# Patient Record
Sex: Female | Born: 1960 | Race: White | Hispanic: No | Marital: Married | Smoking: Former smoker
Health system: Southern US, Community
[De-identification: ages and names within clinical notes are randomized; demographics above are authoritative.]

## PROBLEM LIST (undated history)

## (undated) DIAGNOSIS — C801 Malignant (primary) neoplasm, unspecified: Secondary | ICD-10-CM

## (undated) DIAGNOSIS — T7840XA Allergy, unspecified, initial encounter: Secondary | ICD-10-CM

## (undated) DIAGNOSIS — B019 Varicella without complication: Secondary | ICD-10-CM

## (undated) HISTORY — DX: Varicella without complication: B01.9

## (undated) HISTORY — DX: Allergy, unspecified, initial encounter: T78.40XA

## (undated) HISTORY — PX: TUBAL LIGATION: SHX77

## (undated) HISTORY — PX: SALPINGOOPHORECTOMY: SHX82

---

## 1999-07-19 HISTORY — PX: ABDOMINAL HYSTERECTOMY: SHX81

## 2003-05-12 LAB — HM COLONOSCOPY: HM Colonoscopy: NORMAL

## 2004-06-08 ENCOUNTER — Ambulatory Visit: Payer: Self-pay | Admitting: Unknown Physician Specialty

## 2004-06-17 ENCOUNTER — Ambulatory Visit: Payer: Self-pay | Admitting: Obstetrics & Gynecology

## 2004-06-21 ENCOUNTER — Inpatient Hospital Stay: Payer: Self-pay

## 2005-06-24 ENCOUNTER — Ambulatory Visit: Payer: Self-pay | Admitting: Unknown Physician Specialty

## 2005-09-05 ENCOUNTER — Ambulatory Visit: Payer: Self-pay | Admitting: Gastroenterology

## 2006-09-05 ENCOUNTER — Ambulatory Visit: Payer: Self-pay | Admitting: Unknown Physician Specialty

## 2008-01-16 ENCOUNTER — Ambulatory Visit: Payer: Self-pay | Admitting: Unknown Physician Specialty

## 2008-08-11 ENCOUNTER — Ambulatory Visit: Payer: Self-pay | Admitting: Unknown Physician Specialty

## 2009-04-07 ENCOUNTER — Ambulatory Visit: Payer: Self-pay | Admitting: Unknown Physician Specialty

## 2009-04-14 ENCOUNTER — Ambulatory Visit: Payer: Self-pay | Admitting: Unknown Physician Specialty

## 2009-07-18 HISTORY — PX: OTHER SURGICAL HISTORY: SHX169

## 2010-05-07 ENCOUNTER — Ambulatory Visit: Payer: Self-pay | Admitting: Unknown Physician Specialty

## 2011-05-10 LAB — HM PAP SMEAR: HM Pap smear: NORMAL

## 2011-05-10 LAB — HM MAMMOGRAPHY: HM Mammogram: NORMAL

## 2011-05-13 ENCOUNTER — Ambulatory Visit: Payer: Self-pay | Admitting: Unknown Physician Specialty

## 2012-01-23 ENCOUNTER — Ambulatory Visit: Payer: Self-pay | Admitting: Internal Medicine

## 2012-05-09 ENCOUNTER — Ambulatory Visit (INDEPENDENT_AMBULATORY_CARE_PROVIDER_SITE_OTHER): Payer: BC Managed Care – PPO | Admitting: Internal Medicine

## 2012-05-09 ENCOUNTER — Encounter: Payer: Self-pay | Admitting: Internal Medicine

## 2012-05-09 VITALS — BP 100/64 | HR 68 | Temp 97.7°F | Resp 12 | Ht 66.75 in | Wt 163.0 lb

## 2012-05-09 DIAGNOSIS — E663 Overweight: Secondary | ICD-10-CM

## 2012-05-09 DIAGNOSIS — Z1211 Encounter for screening for malignant neoplasm of colon: Secondary | ICD-10-CM

## 2012-05-09 DIAGNOSIS — Z6825 Body mass index (BMI) 25.0-25.9, adult: Secondary | ICD-10-CM

## 2012-05-09 DIAGNOSIS — R0789 Other chest pain: Secondary | ICD-10-CM

## 2012-05-09 NOTE — Patient Instructions (Addendum)
This is  Dr. Tullos's version of a  "Low GI"  Diet:  All of the foods can be found at grocery stores and in bulk at [employer].  The Atkins protein bars and shakes are available in more varieties at Target, WalMart and [employer].     7 AM Breakfast:  Low carbohydrate Protein  Shakes (I recommend the EAS AdvantEdge "Carb Control" shakes  Or the low carb shakes by Atkins.   Both are available everywhere:  In  cases at BJs  Or in 4 packs at grocery stores and pharmacies  2.5 carbs  (Alternative is  a toasted Arnold's Sandwhich Thin w/ peanut butter, a "Bagel Thin" with cream cheese and salmon) or  a scrambled egg burrito made with a low carb tortilla .  Avoid cereal and bananas, oatmeal too unless you are cooking the old fashioned kind that takes 30-40 minutes to prepare.  the rest is overly processed, has minimal fiber, and is loaded with carbohydrates!   10 AM: Protein bar by Atkins (the snack size, under 200 cal).  There are many varieties , available widely again or in bulk in limited varieties at BJs)  Other so called "protein bars" tend to be loaded with carbohydrates.  Remember, in food advertising, the word "energy" is synonymous for " carbohydrate."  Lunch: sandwich of turkey, (or any lunchmeat, grilled meat or canned tuna), fresh avocado, mayonnaise  and cheese on a lower carbohydrate pita bread, flatbread, or tortilla . Ok to use regular mayonnaise. The bread is the only source or carbohydrate that can be decreased (Joseph's makes a pita bread and a flat bread that are 50 cal and 4 net carbs ; Toufayan makes a low carb flatbread that's 100 cal and 9 net carbs  and  Mission makes a low carb whole wheat tortilla  That is 210 cal and 6 net carbs)  3 PM:  Mid day :  Another protein bar,  Or a  cheese stick (100 cal, 0 carbs),  Or 1 ounce of  almonds, walnuts, pistachios, pecans, peanuts,  Macadamia nuts. Or a Dannon light n Fit greek yogurt, 80 cal 8 net carbs . Avoid "granola"; the dried cranberries  and raisins are loaded with carbohydrates. Mixed nuts ok if no raisins or cranberries or dried fruit.      6 PM  Dinner:  "mean and green:"  Meat/chicken/fish or a high protein legume; , with a green salad, and a low GI  Veggie (broccoli, cauliflower, green beans, spinach, brussel sprouts. Lima beans) : Avoid "Low fat dressings, as well as [place] and [place]! They are loaded with sugar! Instead use ranch, vinagrette,  Blue cheese, etc  9 PM snack : Breyer's "low carb" fudgsicle or  ice cream bar (Carb Smart line), or  Weight Watcher's ice cream bar , or another "no sugar added" ice cream;a serving of fresh berries/cherries with whipped cream (Avoid bananas, pineapple, grapes  and watermelon on a regular basis because they are high in sugar)   Remember that snack Substitutions should be less than 15 to 20 carbs  Per serving. Remember to subtract fiber grams and sugar alcohols to get the "net carbs."    

## 2012-05-09 NOTE — Progress Notes (Signed)
Patient ID: Penny Roberts, female   DOB: 1960/09/20, 51 y.o.   MRN: [id number]   Patient Active Problem List  Diagnosis  . Musculoskeletal chest pain  . Overweight (BMI 25.0-29.9)    Subjective:  CC:   No chief complaint on file.   HPI:   Penny Roberts is a 51 y.o. 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   She is the dtr of Penny Roberts and has been very busy helping her mother regain her health after she developed a prosthetic hip infection which was finally addressed by [facility] after her local specialists failed to recognize ongoing infection, per patient.  She remains  very frustrated by her mother's decline which she feels was unnecessary. 2) Overweight.  Patient does a lot of physcal labor and cardio/interval trraining 3 times per week minimum with intentional  wt loss of 10 lbs,  Her goal is to lose an additional 10-20 more .  She works full time as an Production designer, theatre/television/film at [employer] and uses [employer]. She livers with her husband and 30 yr old child.   Past Medical History  Diagnosis Date  . Allergy   . Chicken pox     Past Surgical History  Procedure Date  . Tubal ligation   . 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 Salpingoophorectomy   . Abdominal hysterectomy 2001    due to emdometriosis    Family History  Problem Relation Age of Onset  . Heart disease Mother     A-Fib  . Cancer Father     colon cancer  . Alcohol abuse Father   . Hypertension Father   . Cancer Maternal Aunt     breast cancer    History   Social History  . Marital Status: Married    Spouse Name: N/A    Number of Children: N/A  . Years of Education: N/A   Occupational History  . Not on file.   Social History Main Topics  . Smoking status: Former Smoker    Quit date: 05/09/2004  . Smokeless  tobacco: Never Used  . Alcohol Use: Yes     occassional alcohol use  . Drug Use: No  . Sexually Active: Not on file   Other Topics Concern  . Not on file   Social History Narrative  . No narrative on file   No Known Allergies   Review of Systems:   The remainder of the review of systems was negative except those addressed in the HPI.       Objective:  BP 100/64  Pulse 68  Temp 97.7 F (36.5 C)  Resp 12  Ht 5' 6.75" (1.695 m)  Wt 163 lb (73.936 kg)  BMI 25.72 kg/m2  SpO2 99%  General appearance: alert, cooperative and appears stated age Ears: normal TM's and external ear canals both ears Throat: lips, mucosa, and tongue normal; teeth and gums normal Neck: no adenopathy, no carotid bruit, supple, symmetrical, trachea midline and thyroid not enlarged, symmetric, no tenderness/mass/nodules Back: symmetric, no curvature. ROM normal. No CVA tenderness. Lungs: clear to auscultation bilaterally Heart: regular rate and rhythm, S1, S2 normal, no murmur, click, rub or gallop Abdomen: soft, non-tender; bowel sounds normal; no masses,  no organomegaly Pulses: 2+ and symmetric Skin: Skin color,  texture, turgor normal. No rashes or lesions Lymph nodes: Cervical, supraclavicular, and axillary nodes normal.  Assessment and Plan:  Musculoskeletal chest pain Secondary use of power tools. Reassurance provided. Continue nonsteroidal anti-inflammatories.  Overweight (BMI 25.0-29.9) She has an excellent job of lowering her BMI to just about 25 with diet and exercise. She has a plateau. I've given her a copy of my low glycemic index diet for additional help in removing unnecessary carbs per her diet.   Updated Medication List Outpatient Encounter Prescriptions as of 05/09/2012  Medication Sig Dispense Refill  . aspirin 81 MG tablet Take 81 mg by mouth daily.      . fish oil-omega-3 fatty acids 1000 MG capsule Take 2 g by mouth daily.      . Multiple Vitamin (MULTIVITAMIN) capsule  Take 1 capsule by mouth daily.         Orders Placed This Encounter  Procedures  . HM MAMMOGRAPHY  . HM PAP SMEAR    No Follow-up on file.

## 2012-05-11 DIAGNOSIS — R0789 Other chest pain: Secondary | ICD-10-CM | POA: Insufficient documentation

## 2012-05-11 DIAGNOSIS — Z1211 Encounter for screening for malignant neoplasm of colon: Secondary | ICD-10-CM | POA: Insufficient documentation

## 2012-05-11 DIAGNOSIS — E663 Overweight: Secondary | ICD-10-CM | POA: Insufficient documentation

## 2012-05-11 NOTE — Assessment & Plan Note (Signed)
Secondary use of power tools. Reassurance provided. Continue nonsteroidal anti-inflammatories.

## 2012-05-11 NOTE — Assessment & Plan Note (Signed)
She has an excellent job of lowering her BMI to just about 25 with diet and exercise. She has a plateau. I've given her a copy of my low glycemic index diet for additional help in removing unnecessary carbs per her diet.

## 2012-05-11 NOTE — Assessment & Plan Note (Signed)
She is due for screening colonoscopy  And requests referral to [facility].

## 2012-07-12 ENCOUNTER — Encounter: Payer: Self-pay | Admitting: Adult Health

## 2012-07-12 ENCOUNTER — Telehealth: Payer: Self-pay | Admitting: Internal Medicine

## 2012-07-12 ENCOUNTER — Ambulatory Visit (INDEPENDENT_AMBULATORY_CARE_PROVIDER_SITE_OTHER): Payer: BC Managed Care – PPO | Admitting: Adult Health

## 2012-07-12 VITALS — BP 116/62 | HR 72 | Temp 98.2°F | Ht 66.75 in | Wt 166.8 lb

## 2012-07-12 DIAGNOSIS — J329 Chronic sinusitis, unspecified: Secondary | ICD-10-CM

## 2012-07-12 MED ORDER — AZITHROMYCIN 250 MG PO TABS: ORAL_TABLET | ORAL | Status: DC

## 2012-07-12 MED ORDER — FLUTICASONE PROPIONATE 50 MCG/ACT NA SUSP: NASAL | Status: DC

## 2012-07-12 NOTE — Progress Notes (Signed)
  Subjective:    Patient ID: Penny Roberts, female    DOB: 1960/11/27, 51 y.o.   MRN: [id number]  HPI  Patient presents to clinic today with congestion, painful sinuses, fluid/fullness in ears that has been ongoing for ~ 8 days. Drainage from sinuses is green in color. She has tried mucinex, and nyquil at night to help her sleep but this has not helped her much. Pt has been using saline to rinse out sinuses. She reports having chills and fever.  Current Outpatient Prescriptions on File Prior to Visit  Medication Sig Dispense Refill  . aspirin 81 MG tablet Take 81 mg by mouth daily.      . fish oil-omega-3 fatty acids 1000 MG capsule Take 2 g by mouth daily.      . Multiple Vitamin (MULTIVITAMIN) capsule Take 1 capsule by mouth daily.      . fluticasone (FLONASE) 50 MCG/ACT nasal spray 2 sprays into each nostril daily  16 g  6      Review of Systems  Constitutional: Positive for fever, chills, appetite change and fatigue.  HENT: Positive for congestion and postnasal drip.   Respiratory: Negative for chest tightness and shortness of breath.   Gastrointestinal: Positive for nausea.  Genitourinary: Negative.   Neurological: Positive for headaches.  Psychiatric/Behavioral: Negative.     BP 116/62  Pulse 72  Temp 98.2 F (36.8 C) (Oral)  Ht 5' 6.75" (1.695 m)  Wt 166 lb 12 oz (75.637 kg)  BMI 26.31 kg/m2  SpO2 97%     Objective:   Physical Exam  Constitutional: She is oriented to person, place, and time. She appears well-developed and well-nourished. No distress.  HENT:  Head: Normocephalic.       Right ear with bulging TM. Erythema of pharynx without exudate. Post nasal drip.  Eyes: Right eye exhibits no discharge. Left eye exhibits no discharge.  Cardiovascular: Normal rate, regular rhythm and normal heart sounds.  Exam reveals no gallop.   No murmur heard. Pulmonary/Chest: Effort normal and breath sounds normal. She has no wheezes. She has no rales.  Lymphadenopathy:    She  has no cervical adenopathy.  Neurological: She is alert and oriented to person, place, and time.  Skin: Skin is warm and dry.  Psychiatric: She has a normal mood and affect. Her behavior is normal. Judgment and thought content normal.           Assessment & Plan:

## 2012-07-12 NOTE — Assessment & Plan Note (Signed)
Ongoing sinusitis for ~ 8 days without resolution. Start azithromycin and flonase.

## 2012-07-12 NOTE — Telephone Encounter (Signed)
Patient Information:  Caller Name: Paiton  Phone: [phone]  Patient: Penny Roberts, Penny Roberts  Gender: Female  DOB: Feb 16, 1961  Age: 51 Years  PCP: Duncan Dull (Adults only)  Pregnant: No  Office Follow Up:  Does the office need to follow up with this patient?: Yes  Instructions For The Office: Please cal ASAP to advise if can be seen when she brings her mother in 07/12/12 at 1600 with Dr Dan Humphreys .  RN Note:  Mild wheezing present with severe sinus pain. Requests appointment at 1600 07/12/12 with Dr Dan Humphreys since she is bringing her mother for an appointment.  Symptoms  Reason For Call & Symptoms: Called for appointment for sinus cognestion with grreen nasal mucus  Reviewed Health History In EMR: Yes  Reviewed Medications In EMR: Yes  Reviewed Allergies In EMR: Yes  Reviewed Surgeries / Procedures: Yes  Date of Onset of Symptoms: 07/04/2012  Treatments Tried: Mucinex D, Nyquil hs, nasal saline  Treatments Tried Worked: Yes OB / GYN:  LMP: Unknown  Guideline(s) Used:  Sinus Pain and Congestion  Disposition Per Guideline:   Go to Office Now  Reason For Disposition Reached:   Severe sinus pain  Advice Given:  Hydration:  Drink plenty of liquids (6-8 glasses of water daily). If the air in your home is dry, use a cool mist humidifier  Expected Course:  Sinus congestion from viral upper respiratory infections (colds) usually lasts 5-10 days.  Occasionally a cold can worsen and turn into bacterial sinusitis. Clues to this are sinus symptoms lasting longer than 10 days, fever lasting longer than 3 days, and worsening pain. Bacterial sinusitis may need antibiotic treatment.

## 2012-07-12 NOTE — Patient Instructions (Addendum)
  Start your antibiotic today. Drink fluids to stay hydrated.  Call if you are not better by Monday.

## 2012-07-12 NOTE — Telephone Encounter (Signed)
Should be fine. We can put her on as acute overbook.  Will be a focused visit just for sinus infection.

## 2012-07-12 NOTE — Telephone Encounter (Signed)
Per Dr. Dan Humphreys we can overbook her in an acute spot.

## 2012-09-17 ENCOUNTER — Ambulatory Visit (INDEPENDENT_AMBULATORY_CARE_PROVIDER_SITE_OTHER): Payer: BC Managed Care – PPO | Admitting: Internal Medicine

## 2012-09-17 ENCOUNTER — Encounter: Payer: Self-pay | Admitting: Internal Medicine

## 2012-09-17 VITALS — BP 104/64 | HR 66 | Temp 98.6°F | Resp 12 | Ht 66.75 in | Wt 165.8 lb

## 2012-09-17 DIAGNOSIS — Z13220 Encounter for screening for lipoid disorders: Secondary | ICD-10-CM

## 2012-09-17 DIAGNOSIS — Z23 Encounter for immunization: Secondary | ICD-10-CM

## 2012-09-17 DIAGNOSIS — Z0000 Encounter for general adult medical examination without abnormal findings: Secondary | ICD-10-CM | POA: Insufficient documentation

## 2012-09-17 DIAGNOSIS — E559 Vitamin D deficiency, unspecified: Secondary | ICD-10-CM

## 2012-09-17 DIAGNOSIS — R5381 Other malaise: Secondary | ICD-10-CM

## 2012-09-17 DIAGNOSIS — R5383 Other fatigue: Secondary | ICD-10-CM

## 2012-09-17 DIAGNOSIS — Z6825 Body mass index (BMI) 25.0-25.9, adult: Secondary | ICD-10-CM

## 2012-09-17 DIAGNOSIS — Z1211 Encounter for screening for malignant neoplasm of colon: Secondary | ICD-10-CM

## 2012-09-17 DIAGNOSIS — E663 Overweight: Secondary | ICD-10-CM

## 2012-09-17 DIAGNOSIS — Z1239 Encounter for other screening for malignant neoplasm of breast: Secondary | ICD-10-CM

## 2012-09-17 LAB — LIPID PANEL
Cholesterol: 227 mg/dL — ABNORMAL HIGH (ref 0–200)
HDL: 93.1 mg/dL (ref >39.00)
Triglycerides: 76 mg/dL (ref 0.0–149.0)
VLDL: 15.2 mg/dL (ref 0.0–40.0)

## 2012-09-17 LAB — COMPREHENSIVE METABOLIC PANEL
AST: 27 U/L (ref 0–37)
Albumin: 3.9 g/dL (ref 3.5–5.2)
Alkaline Phosphatase: 47 U/L (ref 39–117)
BUN: 19 mg/dL (ref 6–23)
Creatinine, Ser: 0.6 mg/dL (ref 0.4–1.2)
Potassium: 4 mEq/L (ref 3.5–5.1)
Total Bilirubin: 0.9 mg/dL (ref 0.3–1.2)

## 2012-09-17 LAB — TSH: TSH: 0.76 u[IU]/mL (ref 0.35–5.50)

## 2012-09-17 NOTE — Assessment & Plan Note (Addendum)
She had a normal colonoscopy in 2007,  due to father's history of colon cancer.  She is overdue for  five-year followup. She will be referred to see her back to Dr. Mechele Collin for repeat colonoscopy.

## 2012-09-17 NOTE — Patient Instructions (Addendum)
 This is  my version of a  "Low GI"  Diet:  It is not ultra low carb, but will still lower your blood sugars and allow you to lose 5 to 10 lbs per month if you follow it carefully. All of the foods can be found at grocery stores and in bulk at [employer].  The Atkins protein bars and shakes are available in more varieties at Target, WalMart and [employer].     7 AM Breakfast:  Low carbohydrate Protein  Shakes (I recommend the EAS AdvantEdge "Carb Control" shakes  Or the low carb shakes by Atkins.   Both are available everywhere:  In  cases at BJs  Or in 4 packs at grocery stores and pharmacies  2.5 carbs  (Alternative is  a toasted Arnold's Sandwhich Thin w/ peanut butter, a "Bagel Thin" with cream cheese and salmon) or  a scrambled egg burrito made with a low carb tortilla .  Avoid cereal and bananas, oatmeal too unless you are cooking the old fashioned kind that takes 30-40 minutes to prepare.  the rest is overly processed, has minimal fiber, and is loaded with carbohydrates!   10 AM: Protein bar by Atkins (the snack size, under 200 cal).  There are many varieties , available widely again or in bulk in limited varieties at BJs)  Other so called "protein bars" tend to be loaded with carbohydrates.  Remember, in food advertising, the word "energy" is synonymous for " carbohydrate."  Lunch: sandwich of turkey, (or any lunchmeat, grilled meat or canned tuna), fresh avocado, mayonnaise  and cheese on a lower carbohydrate pita bread, flatbread, or tortilla . Ok to use regular mayonnaise. The bread is the only source or carbohydrate that can be decreased (Joseph's makes a pita bread and a flat bread that are 50 cal and 4 net carbs ; Toufayan makes a low carb flatbread that's 100 cal and 9 net carbs  and  Mission makes a low carb whole wheat tortilla  That is 210 cal and 6 net carbs)  3 PM:  Mid day :  Another protein bar,  Or a  cheese stick (100 cal, 0 carbs),  Or 1 ounce of  almonds, walnuts, pistachios,  pecans, peanuts,  Macadamia nuts. Or a Dannon light n Fit greek yogurt, 80 cal 8 net carbs . Avoid "granola"; the dried cranberries and raisins are loaded with carbohydrates. Mixed nuts ok if no raisins or cranberries or dried fruit.      6 PM  Dinner:  "mean and green:"  Meat/chicken/fish or a high protein legume; , with a green salad, and a low GI  Veggie (broccoli, cauliflower, green beans, spinach, brussel sprouts. Lima beans) : Avoid "Low fat dressings, as well as [place] and [place]! They are loaded with sugar! Instead use ranch, vinagrette,  Blue cheese, etc.  There is a low carb pasta by Dreamfield's available at [employer] that is acceptable and tastes great. Try Michel Angel's chicken piccata over low carb pasta. The chicken dish is 0 carbs, and can be found in frozen section at BJs and [employer]. Also try Aaron Sanchez's "Carnitas" (pulled pork, no sauce,  0 carbs) and his pot roast.   both are in the refrigerated section at BJs   Dreamfield's makes a low carb pasta only 5 g/serving.  Available at all grocery stores,  And tastes like normal pasta  9 PM snack : Breyer's "low carb" fudgsicle or  ice cream bar (Carb Smart   line), or  Weight Watcher's ice cream bar , or another "no sugar added" ice cream;a serving of fresh berries/cherries with whipped cream (Avoid bananas, pineapple, grapes  and watermelon on a regular basis because they are high in sugar)   Remember that snack Substitutions should be less than 10 carbs per serving and meals < 20 carbs. Remember to subtract fiber grams and sugar alcohols to get the "net carbs."  

## 2012-09-17 NOTE — Assessment & Plan Note (Deleted)
She has lost several pounds since her last visit. She is following a low glycemic index diet and exercising regularly. We discussed her current regimen and recommendations for modifications were offered. Screening for hypothyroidism diabetes and hyperlipidemia to be done today.

## 2012-09-17 NOTE — Progress Notes (Signed)
Patient ID: Penny Roberts, female   DOB: [birth year], 52 y.o.   MRN: [id number]    Subjective:     Penny Roberts is a 52 y.o. female and is here for a comprehensive physical exam. The patient reports  the following concerns:.She is S/p TAH/BSO for severe endometriosis but still getting PAPs (last one by former PCP Oct 12).  Her last mammogram was Oct 2012 and she performs monthly breast exams. She is scheduled to see her dermatologist for recent development of multiple nevi. And finally she recognizes that she is overweight.  She wants to lose 15 more lbs.  She is exercising regularly and following a low carbohydrate diet .  Her goal is 150 lbs    History   Social History  . Marital Status: Married    Spouse Name: N/A    Number of Children: N/A  . Years of Education: N/A   Occupational History  . Not on file.   Social History Main Topics  . Smoking status: Former Smoker    Quit date: 05/09/2004  . Smokeless tobacco: Never Used  . Alcohol Use: Yes     Comment: occassional alcohol use  . Drug Use: No  . Sexually Active: Not on file   Other Topics Concern  . Not on file   Social History Narrative  . No narrative on file   Health Maintenance  Topic Date Due  . Influenza Vaccine  03/18/1962  . Tetanus/tdap  11/17/1980  . Mammogram  11/18/2011  . Colonoscopy  05/11/2013  . Pap Smear  05/09/2014    The following portions of the patient's history were reviewed and updated as appropriate: allergies, current medications, past family history, past medical history, past social history, past surgical history and problem list.  Review of Systems A comprehensive review of systems was negative.   Objective:   BP 104/64  Pulse 66  Temp(Src) 98.6 F (37 C) (Oral)  Ht 5' 6.75" (1.695 m)  Wt 165 lb 12 oz (75.184 kg)  BMI 26.17 kg/m2  SpO2 99%  General Appearance:    Alert, cooperative, no distress, appears stated age  Head:    Normocephalic, without obvious abnormality, atraumatic   Eyes:    PERRL, conjunctiva/corneas clear, EOM's intact, fundi    benign, both eyes  Ears:    Normal TM's and external ear canals, both ears  Nose:   Nares normal, septum midline, mucosa normal, no drainage    or sinus tenderness  Throat:   Lips, mucosa, and tongue normal; teeth and gums normal  Neck:   Supple, symmetrical, trachea midline, no adenopathy;    thyroid:  no enlargement/tenderness/nodules; no carotid   bruit or JVD  Back:     Symmetric, no curvature, ROM normal, no CVA tenderness  Lungs:     Clear to auscultation bilaterally, respirations unlabored  Chest Wall:    No tenderness or deformity   Heart:    Regular rate and rhythm, S1 and S2 normal, no murmur, rub   or gallop  Breast Exam:    No tenderness, masses, or nipple abnormality  Abdomen:     Soft, non-tender, bowel sounds active all four quadrants,    no masses, no organomegaly  Genitalia:    Normal female without lesion, discharge or tenderness. Cervix is surgically absent, adnexa surgically absent. Vaginal vault without discharge or erythema  Extremities:   Extremities normal, atraumatic, no cyanosis or edema  Pulses:   2+ and symmetric all extremities  Skin:   Skin  color, texture, turgor normal, no rashes or lesions  Lymph nodes:   Cervical, supraclavicular, and axillary nodes normal  Neurologic:   CNII-XII intact, normal strength, sensation and reflexes    throughout    .    Assessment:      Routine general medical examination at a health care facility Annual comprehensive exam was done including breast and pelvic exam were done. She is status post total hysterectomy and salpingo-oophorectomy. All screenings have been addressed . She will be referred for repeat colonoscopy in Oct 2014 as she had one in 2004 at age 29 due to first degree relative having colon cancer. Tdap vaccine was given today as this was due  Screening for colon cancer She had a normal colonoscopy in 2007,  due to father's history of colon  cancer.  She is overdue for  five-year followup. She will be referred to see her back to Dr. Mechele Collin for repeat colonoscopy.   Updated Medication List Outpatient Encounter Prescriptions as of 09/17/2012  Medication Sig Dispense Refill  . aspirin 81 MG tablet Take 81 mg by mouth daily.      . calcium carbonate (OS-CAL) 600 MG TABS Take 600 mg by mouth daily.      . fish oil-omega-3 fatty acids 1000 MG capsule Take 1 g by mouth daily.       . Multiple Vitamin (MULTIVITAMIN) capsule Take 1 capsule by mouth daily.      . fluticasone (FLONASE) 50 MCG/ACT nasal spray 2 sprays into each nostril daily  16 g  6  . [DISCONTINUED] azithromycin (ZITHROMAX) 250 MG tablet Take 2 tablets on day 1, then take 1 tablet daily for the next 4 days.  6 tablet  0   No facility-administered encounter medications on file as of 09/17/2012.

## 2012-09-17 NOTE — Assessment & Plan Note (Addendum)
Annual comprehensive exam was done including breast and pelvic exam were done. She is status post total hysterectomy and salpingo-oophorectomy. All screenings have been addressed . She will be referred for repeat colonoscopy in Oct 2014 as she had one in 2004 at age 51 due to first degree relative having colon cancer. Tdap vaccine was given today as this was due

## 2012-09-24 ENCOUNTER — Encounter: Payer: Self-pay | Admitting: *Deleted

## 2012-10-16 ENCOUNTER — Telehealth: Payer: Self-pay | Admitting: General Practice

## 2012-10-16 DIAGNOSIS — Z1211 Encounter for screening for malignant neoplasm of colon: Secondary | ICD-10-CM

## 2012-10-16 NOTE — Telephone Encounter (Signed)
Pt called to inform office that she had a colonoscopy done in February by Dr. Terance Ice?

## 2012-10-25 ENCOUNTER — Ambulatory Visit: Payer: Self-pay | Admitting: Internal Medicine

## 2012-11-22 ENCOUNTER — Encounter: Payer: Self-pay | Admitting: Internal Medicine

## 2014-08-27 ENCOUNTER — Encounter (INDEPENDENT_AMBULATORY_CARE_PROVIDER_SITE_OTHER): Payer: Self-pay

## 2014-08-27 ENCOUNTER — Ambulatory Visit (INDEPENDENT_AMBULATORY_CARE_PROVIDER_SITE_OTHER): Payer: BC Managed Care – PPO | Admitting: Internal Medicine

## 2014-08-27 ENCOUNTER — Encounter: Payer: Self-pay | Admitting: Internal Medicine

## 2014-08-27 VITALS — BP 118/70 | HR 68 | Temp 98.3°F | Resp 16 | Ht 67.5 in | Wt 180.2 lb

## 2014-08-27 DIAGNOSIS — Z23 Encounter for immunization: Secondary | ICD-10-CM

## 2014-08-27 DIAGNOSIS — R5383 Other fatigue: Secondary | ICD-10-CM

## 2014-08-27 DIAGNOSIS — E785 Hyperlipidemia, unspecified: Secondary | ICD-10-CM

## 2014-08-27 DIAGNOSIS — E663 Overweight: Secondary | ICD-10-CM

## 2014-08-27 DIAGNOSIS — Z1239 Encounter for other screening for malignant neoplasm of breast: Secondary | ICD-10-CM

## 2014-08-27 DIAGNOSIS — Z0000 Encounter for general adult medical examination without abnormal findings: Secondary | ICD-10-CM

## 2014-08-27 NOTE — Patient Instructions (Signed)
This is  my version of a  "Low GI"  Weight loss Diet:  It will allow you to lose 4 to 8  lbs  per month if you follow it carefully.  Your goal with exercise is a minimum of 30 minutes of aerobic exercise 5 days per week (Walking does not count once it becomes easy!)     All of the foods can be found at grocery stores and in bulk at [employer].  The Atkins protein bars and shakes are available in more varieties at Target, WalMart and [employer]'s Foods.     7 AM Breakfast:  Choose from the following:  Low carbohydrate Protein  Shakes (I recommend the EAS AdvantEdge "Carb Control" shakes, Atkins,  Muscle Milk or Premier Protein shakes  All are < 4  carbs   a scrambled egg/bacon/cheese burrito made with Mission's "carb balance" whole wheat tortilla  (about 10 net carbs )  A slice of home made fritatta (egg based dish without a crust:  google it)    Avoid cereal and bananas, oatmeal and cream of wheat and grits. They are loaded with carbohydrates! You can make your own "smootjies".   Just avoid bananas and pineapple   10 AM: high protein snack  Protein bar by Atkins  Or KIND  (the snack size, under 200 cal, usually < 6 net carbs).    A stick of cheese:  Around 1 carb,  100 cal      Other so called "protein bars" and Greek yogurts tend to be loaded with carbohydrates.  Remember, in food advertising, the word "energy" is synonymous for " carbohydrate."  Lunch:   A Sandwich using the bread choices listed, Can use any  Eggs,  lunchmeat, grilled meat or canned tuna), avocado, regular mayo/mustard  and cheese.  A Salad using blue cheese, ranch,  Goddess or vinagrette,  No croutons or "confetti" and no "candied nuts" but regular nuts OK.   No pretzels or chips.  Pickles and miniature sweet peppers are a good low carb alternative that provide a "crunch"  The bread is the only source of carbohydrate in a sandwich and  can be decreased by trying some of these alternatives to traditional loaf bread  Joseph's  makes a pita bread and a flat bread that are 50 cal and 4 net carbs available at BJs and WalMart.  This can be toasted to use with hummous as well  Toufayan makes a low carb flatbread that's 100 cal and 9 net carbs available at [employer] and [employer] makes 2 sizes of  Low carb whole wheat tortilla  (The large one is 210 cal and 6 net carbs)  Flat Out makes flatbreads that are low carb as well  Avoid "Low fat dressings, as well as Reyne Dumas and [address] dressings They are loaded with sugar!   3 PM/ Mid day  Snack:  Consider  1 ounce of  almonds, walnuts, pistachios, pecans, peanuts,  Macadamia nuts or a nut medley.  Avoid "granola"; the dried cranberries and raisins are loaded with carbohydrates. Mixed nuts as long as there are no raisins,  cranberries or dried fruit.    Try the prosciutto/mozzarella cheese sticks by [employer]  In deli /backery section   High protein   To avoid overindulging in snacks: Try drinking a glass of unsweeted almond/coconut milk  Or a cup of coffee with your Atkins chocolate bar to keep you from having 3!!!   Pork rinds!  Yes  Pork Rinds        6 PM  Dinner:     Meat/fowl/fish with a green salad, and either broccoli, cauliflower, green beans, spinach, brussel sprouts or  Lima beans. DO NOT BREAD THE PROTEIN!!      There is a low carb pasta by Dreamfield's that is acceptable and tastes great: only 5 digestible carbs/serving.( All grocery stores but BJs carry it )  Try Hurley Cisco Angelo's chicken piccata or chicken or eggplant parm over low carb pasta.(Lowes and BJs)   Marjory Lies Sanchez's "Carnitas" (pulled pork, no sauce,  0 carbs) or his beef pot roast to make a dinner burrito (at BJ's)  Pesto over low carb pasta (bj's sells a good quality pesto in the center refrigerated section of the deli   Try satueeing  Cheral Marker with mushroooms  Whole wheat pasta is still full of digestible carbs and  Not as low in glycemic index as Dreamfield's.   Brown rice is still rice,   So skip the rice and noodles if you eat [place] or [place] (or at least limit to 1/2 cup)  9 PM snack :   Breyer's "low carb" fudgsicle or  ice cream bar (Carb Smart line), or  Weight Watcher's ice cream bar , or another "no sugar added" ice cream;  a serving of fresh berries/cherries with whipped cream   Cheese or DANNON'S LlGHT N FIT GREEK YOGURT or the [place] greek yogurt   8 ounces of [place] unsweetened almond/cococunut milk  Cheese and crackers (using WASA crackers,  They are low carb) or peanut butter on low carb crackers or pita bread     Avoid bananas, pineapple, grapes  and watermelon on a regular basis because they are high in sugar.  THINK OF THEM AS DESSERT  Remember that snack Substitutions should be less than 10 NET carbs per serving and meals should be < 20 net carbs. Remember that carbohydrates from fiber do not affect blood sugar, so you can  subtract fiber grams to get the "net carbs " of any particular food item.    Health Maintenance Adopting a healthy lifestyle and getting preventive care can go a long way to promote health and wellness. Talk with your health care provider about what schedule of regular examinations is right for you. This is a good chance for you to check in with your provider about disease prevention and staying healthy. In between checkups, there are plenty of things you can do on your own. Experts have done a lot of research about which lifestyle changes and preventive measures are most likely to keep you healthy. Ask your health care provider for more information. WEIGHT AND DIET  Eat a healthy diet  Be sure to include plenty of vegetables, fruits, low-fat dairy products, and lean protein.  Do not eat a lot of foods high in solid fats, added sugars, or salt.  Get regular exercise. This is one of the most important things you can do for your health.  Most adults should exercise for at least 150 minutes each week. The exercise should increase your  heart rate and make you sweat (moderate-intensity exercise).  Most adults should also do strengthening exercises at least twice a week. This is in addition to the moderate-intensity exercise.  Maintain a healthy weight  Body mass index (BMI) is a measurement that can be used to identify possible weight problems. It estimates body fat based on height and weight. Your health care provider can help determine your  BMI and help you achieve or maintain a healthy weight.  For females 26 years of age and older:   A BMI below 18.5 is considered underweight.  A BMI of 18.5 to 24.9 is normal.  A BMI of 25 to 29.9 is considered overweight.  A BMI of 30 and above is considered obese.  Watch levels of cholesterol and blood lipids  You should start having your blood tested for lipids and cholesterol at 53 years of age, then have this test every 5 years.  You may need to have your cholesterol levels checked more often if:  Your lipid or cholesterol levels are high.  You are older than 53 years of age.  You are at high risk for heart disease.  CANCER SCREENING   Lung Cancer  Lung cancer screening is recommended for adults 20-4 years old who are at high risk for lung cancer because of a history of smoking.  A yearly low-dose CT scan of the lungs is recommended for people who:  Currently smoke.  Have quit within the past 15 years.  Have at least a 30-pack-year history of smoking. A pack year is smoking an average of one pack of cigarettes a day for 1 year.  Yearly screening should continue until it has been 15 years since you quit.  Yearly screening should stop if you develop a health problem that would prevent you from having lung cancer treatment.  Breast Cancer  Practice breast self-awareness. This means understanding how your breasts normally appear and feel.  It also means doing regular breast self-exams. Let your health care provider know about any changes, no matter how  small.  If you are in your 20s or 30s, you should have a clinical breast exam (CBE) by a health care provider every 1-3 years as part of a regular health exam.  If you are 13 or older, have a CBE every year. Also consider having a breast X-ray (mammogram) every year.  If you have a family history of breast cancer, talk to your health care provider about genetic screening.  If you are at high risk for breast cancer, talk to your health care provider about having an MRI and a mammogram every year.  Breast cancer gene (BRCA) assessment is recommended for women who have family members with BRCA-related cancers. BRCA-related cancers include:  Breast.  Ovarian.  Tubal.  Peritoneal cancers.  Results of the assessment will determine the need for genetic counseling and BRCA1 and BRCA2 testing. Cervical Cancer Routine pelvic examinations to screen for cervical cancer are no longer recommended for nonpregnant women who are considered low risk for cancer of the pelvic organs (ovaries, uterus, and vagina) and who do not have symptoms. A pelvic examination may be necessary if you have symptoms including those associated with pelvic infections. Ask your health care provider if a screening pelvic exam is right for you.   The Pap test is the screening test for cervical cancer for women who are considered at risk.  If you had a hysterectomy for a problem that was not cancer or a condition that could lead to cancer, then you no longer need Pap tests.  If you are older than 65 years, and you have had normal Pap tests for the past 10 years, you no longer need to have Pap tests.  If you have had past treatment for cervical cancer or a condition that could lead to cancer, you need Pap tests and screening for cancer for at least 20  years after your treatment.  If you no longer get a Pap test, assess your risk factors if they change (such as having a new sexual partner). This can affect whether you should  start being screened again.  Some women have medical problems that increase their chance of getting cervical cancer. If this is the case for you, your health care provider may recommend more frequent screening and Pap tests.  The human papillomavirus (HPV) test is another test that may be used for cervical cancer screening. The HPV test looks for the virus that can cause cell changes in the cervix. The cells collected during the Pap test can be tested for HPV.  The HPV test can be used to screen women 62 years of age and older. Getting tested for HPV can extend the interval between normal Pap tests from three to five years.  An HPV test also should be used to screen women of any age who have unclear Pap test results.  After 53 years of age, women should have HPV testing as often as Pap tests.  Colorectal Cancer  This type of cancer can be detected and often prevented.  Routine colorectal cancer screening usually begins at 53 years of age and continues through 53 years of age.  Your health care provider may recommend screening at an earlier age if you have risk factors for colon cancer.  Your health care provider may also recommend using home test kits to check for hidden blood in the stool.  A small camera at the end of a tube can be used to examine your colon directly (sigmoidoscopy or colonoscopy). This is done to check for the earliest forms of colorectal cancer.  Routine screening usually begins at age 41.  Direct examination of the colon should be repeated every 5-10 years through 53 years of age. However, you may need to be screened more often if early forms of precancerous polyps or small growths are found. Skin Cancer  Check your skin from head to toe regularly.  Tell your health care provider about any new moles or changes in moles, especially if there is a change in a mole's shape or color.  Also tell your health care provider if you have a mole that is larger than the size  of a pencil eraser.  Always use sunscreen. Apply sunscreen liberally and repeatedly throughout the day.  Protect yourself by wearing long sleeves, pants, a wide-brimmed hat, and sunglasses whenever you are outside. HEART DISEASE, DIABETES, AND HIGH BLOOD PRESSURE   Have your blood pressure checked at least every 1-2 years. High blood pressure causes heart disease and increases the risk of stroke.  If you are between 22 years and 50 years old, ask your health care provider if you should take aspirin to prevent strokes.  Have regular diabetes screenings. This involves taking a blood sample to check your fasting blood sugar level.  If you are at a normal weight and have a low risk for diabetes, have this test once every three years after 53 years of age.  If you are overweight and have a high risk for diabetes, consider being tested at a younger age or more often. PREVENTING INFECTION  Hepatitis B  If you have a higher risk for hepatitis B, you should be screened for this virus. You are considered at high risk for hepatitis B if:  You were born in a country where hepatitis B is common. Ask your health care provider which countries are considered high  risk.  Your parents were born in a high-risk country, and you have not been immunized against hepatitis B (hepatitis B vaccine).  You have HIV or AIDS.  You use needles to inject street drugs.  You live with someone who has hepatitis B.  You have had sex with someone who has hepatitis B.  You get hemodialysis treatment.  You take certain medicines for conditions, including cancer, organ transplantation, and autoimmune conditions. Hepatitis C  Blood testing is recommended for:  Everyone born from [birth year] through 1964.  Anyone with known risk factors for hepatitis C. Sexually transmitted infections (STIs)  You should be screened for sexually transmitted infections (STIs) including gonorrhea and chlamydia if:  You are sexually  active and are younger than 53 years of age.  You are older than 53 years of age and your health care provider tells you that you are at risk for this type of infection.  Your sexual activity has changed since you were last screened and you are at an increased risk for chlamydia or gonorrhea. Ask your health care provider if you are at risk.  If you do not have HIV, but are at risk, it may be recommended that you take a prescription medicine daily to prevent HIV infection. This is called pre-exposure prophylaxis (PrEP). You are considered at risk if:  You are sexually active and do not regularly use condoms or know the HIV status of your partner(s).  You take drugs by injection.  You are sexually active with a partner who has HIV. Talk with your health care provider about whether you are at high risk of being infected with HIV. If you choose to begin PrEP, you should first be tested for HIV. You should then be tested every 3 months for as long as you are taking PrEP.  PREGNANCY   If you are premenopausal and you may become pregnant, ask your health care provider about preconception counseling.  If you may become pregnant, take 400 to 800 micrograms (mcg) of folic acid every day.  If you want to prevent pregnancy, talk to your health care provider about birth control (contraception). OSTEOPOROSIS AND MENOPAUSE   Osteoporosis is a disease in which the bones lose minerals and strength with aging. This can result in serious bone fractures. Your risk for osteoporosis can be identified using a bone density scan.  If you are 13 years of age or older, or if you are at risk for osteoporosis and fractures, ask your health care provider if you should be screened.  Ask your health care provider whether you should take a calcium or vitamin D supplement to lower your risk for osteoporosis.  Menopause may have certain physical symptoms and risks.  Hormone replacement therapy may reduce some of these  symptoms and risks. Talk to your health care provider about whether hormone replacement therapy is right for you.  HOME CARE INSTRUCTIONS   Schedule regular health, dental, and eye exams.  Stay current with your immunizations.   Do not use any tobacco products including cigarettes, chewing tobacco, or electronic cigarettes.  If you are pregnant, do not drink alcohol.  If you are breastfeeding, limit how much and how often you drink alcohol.  Limit alcohol intake to no more than 1 drink per day for nonpregnant women. One drink equals 12 ounces of beer, 5 ounces of wine, or 1 ounces of hard liquor.  Do not use street drugs.  Do not share needles.  Ask your health care provider for  help if you need support or information about quitting drugs.  Tell your health care provider if you often feel depressed.  Tell your health care provider if you have ever been abused or do not feel safe at home. Document Released: 01/17/2011 Document Revised: 11/18/2013 Document Reviewed: 06/05/2013 [facility] Patient Information 2015 [place], [place]. This information is not intended to replace advice given to you by your health care provider. Make sure you discuss any questions you have with your health care provider.

## 2014-08-27 NOTE — Progress Notes (Signed)
Patient ID: Penny Roberts, female   DOB: [birth year], 54 y.o.   MRN: [id number]   Subjective:     Penny Roberts is a 54 y.o. female and is here for a comprehensive physical exam. The patient reports weight gain.Durene Cal for colonocopoy Nov 2013 ,  Done by Herbert Deaner,  Was normal.  Follow up 5 years.  report not available  FH of colon ca.    TAH/BSO no PAP needed   mammogram normal October 25 2012 [place] .  Did not get one last year.   Spent 10 mintues discussing mother's health .    Has gained 15 lbs since last visit.  Not exercising regularly due to work issues and increased responsibilities      History   Social History  . Marital Status: Married    Spouse Name: N/A  . Number of Children: N/A  . Years of Education: N/A   Occupational History  . Not on file.   Social History Main Topics  . Smoking status: Former Smoker    Quit date: 05/09/2004  . Smokeless tobacco: Never Used  . Alcohol Use: 2.4 oz/week    4 Glasses of wine per week     Comment: occassional alcohol use  . Drug Use: No  . Sexual Activity: Yes    Birth Control/ Protection: Surgical   Other Topics Concern  . Not on file   Social History Narrative   Health Maintenance  Topic Date Due  . PAP SMEAR  05/09/2014  . MAMMOGRAM  10/26/2014  . INFLUENZA VACCINE  02/16/2015  . COLONOSCOPY  09/06/2015  . TETANUS/TDAP  09/18/2022    The following portions of the patient's history were reviewed and updated as appropriate: allergies, current medications, past family history, past medical history, past social history, past surgical history and problem list.  Review of Systems  Patient denies headache, fevers, malaise, unintentional weight loss, skin rash, eye pain, sinus congestion and sinus pain, sore throat, dysphagia,  hemoptysis , cough, dyspnea, wheezing, chest pain, palpitations, orthopnea, edema, abdominal pain, nausea, melena, diarrhea, constipation, flank pain, dysuria, hematuria, urinary  Frequency,  nocturia, numbness, tingling, seizures,  Focal weakness, Loss of consciousness,  Tremor, insomnia, depression, anxiety, and suicidal ideation.      Objective:  BP 118/70 mmHg  Pulse 68  Temp(Src) 98.3 F (36.8 C) (Oral)  Resp 16  Ht 5' 7.5" (1.715 m)  Wt 180 lb 4 oz (81.761 kg)  BMI 27.80 kg/m2  SpO2 98%  General appearance: alert, cooperative and appears stated age Head: Normocephalic, without obvious abnormality, atraumatic Eyes: conjunctivae/corneas clear. PERRL, EOM's intact. Fundi benign. Ears: normal TM's and external ear canals both ears Nose: Nares normal. Septum midline. Mucosa normal. No drainage or sinus tenderness. Throat: lips, mucosa, and tongue normal; teeth and gums normal Neck: no adenopathy, no carotid bruit, no JVD, supple, symmetrical, trachea midline and thyroid not enlarged, symmetric, no tenderness/mass/nodules Lungs: clear to auscultation bilaterally Breasts: normal appearance, no masses or tenderness Heart: regular rate and rhythm, S1, S2 normal, no murmur, click, rub or gallop Abdomen: soft, non-tender; bowel sounds normal; no masses,  no organomegaly Extremities: extremities normal, atraumatic, no cyanosis or edema Pulses: 2+ and symmetric Skin: Skin color, texture, turgor normal. No rashes or lesions Neurologic: Alert and oriented X 3, normal strength and tone. Normal symmetric reflexes. Normal coordination and gait.   .    Assessment and Plan:   Problem List Items Addressed This Visit    Routine general medical examination  at a health care facility    Annual wellness  exam was done as well as a comprehensive physical exam and management of acute and chronic conditions .  During the course of the visit the patient was educated and counseled about appropriate screening and preventive services including :  diabetes screening, lipid analysis with projected  10 year  risk for CAD , nutrition counseling, colorectal cancer screening, and recommended  immunizations.  Printed recommendations for health maintenance screenings was given.        Overweight (BMI 25.0-29.9)     I have addressed  BMI and recommended a low glycemic index diet utilizing smaller more frequent meals to increase metabolism.  I have also recommended that patient start exercising with a goal of 30 minutes of aerobic exercise a minimum of 5 days per week. Screening for lipid disorders, thyroid and diabetes to be done..         Other Visit Diagnoses    Hyperlipidemia    -  Primary    Relevant Orders    Lipid panel    Encounter for immunization        Other fatigue        Relevant Orders    Comprehensive metabolic panel    CBC with Differential/Platelet    TSH    Breast cancer screening        Relevant Orders    MM DIGITAL SCREENING BILATERAL

## 2014-08-29 NOTE — Assessment & Plan Note (Addendum)
I have addressed  BMI and recommended a low glycemic index diet utilizing smaller more frequent meals to increase metabolism.  I have also recommended that patient start exercising with a goal of 30 minutes of aerobic exercise a minimum of 5 days per week. Screening for lipid disorders, thyroid and diabetes to be done today.   

## 2014-08-29 NOTE — Assessment & Plan Note (Addendum)
Annual wellness  exam was done as well as a comprehensive physical exam and management of acute and chronic conditions .  During the course of the visit the patient was educated and counseled about appropriate screening and preventive services including :  diabetes screening, lipid analysis with projected  10 year  risk for CAD , nutrition counseling, colorectal cancer screening, and recommended immunizations.  Printed recommendations for health maintenance screenings was given. She will return for fasting labs

## 2014-09-08 ENCOUNTER — Other Ambulatory Visit: Payer: BC Managed Care – PPO

## 2014-09-12 ENCOUNTER — Telehealth: Payer: Self-pay | Admitting: Internal Medicine

## 2014-09-12 NOTE — Telephone Encounter (Signed)
Patient is calling to set up a mammogram appointment.

## 2014-09-12 NOTE — Telephone Encounter (Signed)
Have printed order to be signed will fax to [facility] so Mammogram can be scheduled.

## 2014-09-16 ENCOUNTER — Other Ambulatory Visit (INDEPENDENT_AMBULATORY_CARE_PROVIDER_SITE_OTHER): Payer: BC Managed Care – PPO

## 2014-09-16 DIAGNOSIS — R5383 Other fatigue: Secondary | ICD-10-CM

## 2014-09-16 DIAGNOSIS — E785 Hyperlipidemia, unspecified: Secondary | ICD-10-CM

## 2014-09-16 LAB — CBC WITH DIFFERENTIAL/PLATELET
BASOS ABS: 0 10*3/uL (ref 0.0–0.1)
Basophils Relative: 0.7 % (ref 0.0–3.0)
EOS ABS: 0.2 10*3/uL (ref 0.0–0.7)
Eosinophils Relative: 4.4 % (ref 0.0–5.0)
HEMATOCRIT: 41.2 % (ref 36.0–46.0)
HEMOGLOBIN: 13.9 g/dL (ref 12.0–15.0)
LYMPHS PCT: 25.8 % (ref 12.0–46.0)
Lymphs Abs: 1.4 10*3/uL (ref 0.7–4.0)
MCHC: 33.9 g/dL (ref 30.0–36.0)
MCV: 89.6 fl (ref 78.0–100.0)
MONO ABS: 0.6 10*3/uL (ref 0.1–1.0)
Monocytes Relative: 12.1 % — ABNORMAL HIGH (ref 3.0–12.0)
NEUTROS ABS: 3 10*3/uL (ref 1.4–7.7)
Neutrophils Relative %: 57 % (ref 43.0–77.0)
Platelets: 210 10*3/uL (ref 150.0–400.0)
RBC: 4.59 Mil/uL (ref 3.87–5.11)
RDW: 13.7 % (ref 11.5–15.5)
WBC: 5.3 10*3/uL (ref 4.0–10.5)

## 2014-09-16 LAB — COMPREHENSIVE METABOLIC PANEL
ALBUMIN: 4.3 g/dL (ref 3.5–5.2)
ALT: 33 U/L (ref 0–35)
AST: 32 U/L (ref 0–37)
Alkaline Phosphatase: 54 U/L (ref 39–117)
BUN: 13 mg/dL (ref 6–23)
CALCIUM: 9.7 mg/dL (ref 8.4–10.5)
CO2: 29 mEq/L (ref 19–32)
CREATININE: 0.73 mg/dL (ref 0.40–1.20)
Chloride: 103 mEq/L (ref 96–112)
GFR: 88.69 mL/min (ref >60.00)
Glucose, Bld: 92 mg/dL (ref 70–99)
POTASSIUM: 4.2 meq/L (ref 3.5–5.1)
Sodium: 141 mEq/L (ref 135–145)
Total Bilirubin: 0.7 mg/dL (ref 0.2–1.2)
Total Protein: 7.2 g/dL (ref 6.0–8.3)

## 2014-09-16 LAB — LIPID PANEL
CHOL/HDL RATIO: 2
CHOLESTEROL: 234 mg/dL — AB (ref 0–200)
HDL: 99.5 mg/dL (ref >39.00)
LDL Cholesterol: 122 mg/dL — ABNORMAL HIGH (ref 0–99)
NonHDL: 134.5
TRIGLYCERIDES: 63 mg/dL (ref 0.0–149.0)
VLDL: 12.6 mg/dL (ref 0.0–40.0)

## 2014-09-16 LAB — TSH: TSH: 0.88 u[IU]/mL (ref 0.35–4.50)

## 2014-09-18 ENCOUNTER — Encounter: Payer: Self-pay | Admitting: *Deleted

## 2014-10-28 ENCOUNTER — Ambulatory Visit
Admit: 2014-10-28 | Disposition: A | Discharge status: Home / Self Care | Payer: Self-pay | Attending: Internal Medicine | Admitting: Internal Medicine

## 2014-11-27 ENCOUNTER — Encounter: Payer: Self-pay | Admitting: Internal Medicine

## 2016-01-18 ENCOUNTER — Telehealth: Payer: Self-pay | Admitting: Internal Medicine

## 2016-01-18 MED ORDER — OLOPATADINE HCL 0.1 % OP SOLN: 1.0000 [drp] | Freq: Two times a day (BID) | OPHTHALMIC | Status: DC

## 2016-01-18 NOTE — Telephone Encounter (Signed)
We have not appts available today and pt has a swollen eye and wants something for her eye. Please call her on cell phone.

## 2016-01-18 NOTE — Telephone Encounter (Signed)
Notified Patient of RX. thanks

## 2016-01-18 NOTE — Telephone Encounter (Signed)
patanol deye drops sent .  This is a topical antihistamine for the eyes.  No antibiotics or steroids

## 2016-01-18 NOTE — Telephone Encounter (Signed)
Spoke with patient.  Swelling is in both eyes.  When asked when this started she stated, "for some time" no definite date know.  She believes it is a reaction to something.  They are swollen, red and itchy, no drainage.  She states they feel rough, almost raw to touch.  She has stopped using all makeup products, and has only been putting mositurizer on it.  She has taken children's benadryl recently for it.  This has never happened before.  She thinks it may have been from a makeup cleanser wipe or makeup in general.  It initially looked better, but it is now worse this morning.  I advised her we have no appts available today.

## 2016-04-19 ENCOUNTER — Ambulatory Visit (INDEPENDENT_AMBULATORY_CARE_PROVIDER_SITE_OTHER): Payer: BC Managed Care – PPO | Admitting: Internal Medicine

## 2016-04-19 ENCOUNTER — Encounter: Payer: Self-pay | Admitting: Internal Medicine

## 2016-04-19 VITALS — BP 122/76 | HR 64 | Temp 98.1°F | Resp 12 | Ht 66.5 in | Wt 185.5 lb

## 2016-04-19 DIAGNOSIS — Z90710 Acquired absence of both cervix and uterus: Secondary | ICD-10-CM | POA: Diagnosis not present

## 2016-04-19 DIAGNOSIS — Z23 Encounter for immunization: Secondary | ICD-10-CM | POA: Diagnosis not present

## 2016-04-19 DIAGNOSIS — Z0000 Encounter for general adult medical examination without abnormal findings: Secondary | ICD-10-CM | POA: Diagnosis not present

## 2016-04-19 DIAGNOSIS — Z1231 Encounter for screening mammogram for malignant neoplasm of breast: Secondary | ICD-10-CM | POA: Diagnosis not present

## 2016-04-19 DIAGNOSIS — Z1239 Encounter for other screening for malignant neoplasm of breast: Secondary | ICD-10-CM

## 2016-04-19 DIAGNOSIS — D492 Neoplasm of unspecified behavior of bone, soft tissue, and skin: Secondary | ICD-10-CM | POA: Diagnosis not present

## 2016-04-19 DIAGNOSIS — E559 Vitamin D deficiency, unspecified: Secondary | ICD-10-CM

## 2016-04-19 DIAGNOSIS — N952 Postmenopausal atrophic vaginitis: Secondary | ICD-10-CM | POA: Diagnosis not present

## 2016-04-19 DIAGNOSIS — E663 Overweight: Secondary | ICD-10-CM

## 2016-04-19 DIAGNOSIS — Z90721 Acquired absence of ovaries, unilateral: Secondary | ICD-10-CM

## 2016-04-19 LAB — COMPREHENSIVE METABOLIC PANEL
ALT: 34 U/L (ref 0–35)
AST: 28 U/L (ref 0–37)
Albumin: 4.1 g/dL (ref 3.5–5.2)
Alkaline Phosphatase: 60 U/L (ref 39–117)
BUN: 17 mg/dL (ref 6–23)
CALCIUM: 9.1 mg/dL (ref 8.4–10.5)
CHLORIDE: 102 meq/L (ref 96–112)
CO2: 32 meq/L (ref 19–32)
CREATININE: 0.75 mg/dL (ref 0.40–1.20)
GFR: 85.46 mL/min (ref >60.00)
Glucose, Bld: 90 mg/dL (ref 70–99)
POTASSIUM: 4.2 meq/L (ref 3.5–5.1)
Sodium: 140 mEq/L (ref 135–145)
Total Bilirubin: 0.4 mg/dL (ref 0.2–1.2)
Total Protein: 7.3 g/dL (ref 6.0–8.3)

## 2016-04-19 LAB — LIPID PANEL
CHOLESTEROL: 260 mg/dL — AB (ref 0–200)
HDL: 103.9 mg/dL (ref >39.00)
LDL Cholesterol: 130 mg/dL — ABNORMAL HIGH (ref 0–99)
NONHDL: 156.26
TRIGLYCERIDES: 130 mg/dL (ref 0.0–149.0)
Total CHOL/HDL Ratio: 3
VLDL: 26 mg/dL (ref 0.0–40.0)

## 2016-04-19 LAB — VITAMIN D 25 HYDROXY (VIT D DEFICIENCY, FRACTURES): VITD: 34.36 ng/mL (ref 30.00–100.00)

## 2016-04-19 LAB — TSH: TSH: 0.87 u[IU]/mL (ref 0.35–4.50)

## 2016-04-19 MED ORDER — ESTROGENS, CONJUGATED 0.625 MG/GM VA CREA: 1.0000 | TOPICAL_CREAM | Freq: Every day | VAGINAL | 12 refills | Status: DC

## 2016-04-19 NOTE — Patient Instructions (Signed)
Mammogram and dermatology referrals are in process  Premarin estrogen vaginal cream has been prescribed for management of changes associated with menopause  The  diet I discussed with you today is the 10 day Green Smoothie Cleansing /Detox Diet by Linden Dolin . available on [place] for around $10.  It does require a blender, (Vita Mix, a electric juicer,  Or a Nutribullet Rx).  This is not a low carb or a weight loss diet,  It is fundamentally a "cleansing" low fat diet that eliminates sugar, gluten, caffeine, alcohol and dairy for 10 days .  What you add back after the initial ten days is entirely up to  you!  You can expect to lose 5 to 10 lbs depending on how strict you are.   I found that  drinking 2 smoothies or juices  daily and keeping one chewable meal (but keep it simple, like baked fish and salad, rice or bok choy) kept me satisfied and kept me from straying  .  You snack primarily on fresh  fruit, egg whites and judicious quantities of nuts.  You can add a  vegetable based protein powder  to any smoothie made with almond milk (nothing with whey , since whey is dairy)  [employer] has a few but  the [employer] has the greatest  selection .  Using frozen fruits is much more convenient and cost effective. You can even find plenty of organic fruit in the frozen fruit section of [employer].  Just thaw what you need for the following day the night before in the refrigerator (to avoid jamming up your machine)   The organic vegan protein powder I tried  is called Vega" and I found it at [employer] .  It is sugar free. Tastes like crap.  My advice:  Sarina Ser your protein  (eat an egg or two in the am with your smoothie or add soy yogurt for protein ) ,  Don't ruin the taste of your smoothies with protein powder unless you can find one you really love.   Menopause is a normal process in which your reproductive ability comes to an end. This process happens gradually over a span of months to years, usually between  the ages of 38 and 43. Menopause is complete when you have missed 12 consecutive menstrual periods. It is important to talk with your health care provider about some of the most common conditions that affect postmenopausal women, such as heart disease, cancer, and bone loss (osteoporosis). Adopting a healthy lifestyle and getting preventive care can help to promote your health and wellness. Those actions can also lower your chances of developing some of these common conditions. WHAT SHOULD I KNOW ABOUT MENOPAUSE? During menopause, you may experience a number of symptoms, such as:  Moderate-to-severe hot flashes.  Night sweats.  Decrease in sex drive.  Mood swings.  Headaches.  Tiredness.  Irritability.  Memory problems.  Insomnia. Choosing to treat or not to treat menopausal changes is an individual decision that you make with your health care provider. WHAT SHOULD I KNOW ABOUT HORMONE REPLACEMENT THERAPY AND SUPPLEMENTS? Hormone therapy products are effective for treating symptoms that are associated with menopause, such as hot flashes and night sweats. Hormone replacement carries certain risks, especially as you become older. If you are thinking about using estrogen or estrogen with progestin treatments, discuss the benefits and risks with your health care provider. WHAT SHOULD I KNOW ABOUT HEART DISEASE AND STROKE? Heart disease, heart attack, and stroke  become more likely as you age. This may be due, in part, to the hormonal changes that your body experiences during menopause. These can affect how your body processes dietary fats, triglycerides, and cholesterol. Heart attack and stroke are both medical emergencies. There are many things that you can do to help prevent heart disease and stroke:  Have your blood pressure checked at least every 1-2 years. High blood pressure causes heart disease and increases the risk of stroke.  If you are 43-40 years old, ask your health care  provider if you should take aspirin to prevent a heart attack or a stroke.  Do not use any tobacco products, including cigarettes, chewing tobacco, or electronic cigarettes. If you need help quitting, ask your health care provider.  It is important to eat a healthy diet and maintain a healthy weight.  Be sure to include plenty of vegetables, fruits, low-fat dairy products, and lean protein.  Avoid eating foods that are high in solid fats, added sugars, or salt (sodium).  Get regular exercise. This is one of the most important things that you can do for your health.  Try to exercise for at least 150 minutes each week. The type of exercise that you do should increase your heart rate and make you sweat. This is known as moderate-intensity exercise.  Try to do strengthening exercises at least twice each week. Do these in addition to the moderate-intensity exercise.  Know your numbers.Ask your health care provider to check your cholesterol and your blood glucose. Continue to have your blood tested as directed by your health care provider. WHAT SHOULD I KNOW ABOUT CANCER SCREENING? There are several types of cancer. Take the following steps to reduce your risk and to catch any cancer development as early as possible. Breast Cancer  Practice breast self-awareness.  This means understanding how your breasts normally appear and feel.  It also means doing regular breast self-exams. Let your health care provider know about any changes, no matter how small.  If you are 24 or older, have a clinician do a breast exam (clinical breast exam or CBE) every year. Depending on your age, family history, and medical history, it may be recommended that you also have a yearly breast X-ray (mammogram).  If you have a family history of breast cancer, talk with your health care provider about genetic screening.  If you are at high risk for breast cancer, talk with your health care provider about having an MRI  and a mammogram every year.  Breast cancer (BRCA) gene test is recommended for women who have family members with BRCA-related cancers. Results of the assessment will determine the need for genetic counseling and BRCA1 and for BRCA2 testing. BRCA-related cancers include these types:  Breast. This occurs in males or females.  Ovarian.  Tubal. This may also be called fallopian tube cancer.  Cancer of the abdominal or pelvic lining (peritoneal cancer).  Prostate.  Pancreatic. Cervical, Uterine, and Ovarian Cancer Your health care provider may recommend that you be screened regularly for cancer of the pelvic organs. These include your ovaries, uterus, and vagina. This screening involves a pelvic exam, which includes checking for microscopic changes to the surface of your cervix (Pap test).  For women ages 21-65, health care providers may recommend a pelvic exam and a Pap test every three years. For women ages 34-65, they may recommend the Pap test and pelvic exam, combined with testing for human papilloma virus (HPV), every five years. Some types of  HPV increase your risk of cervical cancer. Testing for HPV may also be done on women of any age who have unclear Pap test results.  Other health care providers may not recommend any screening for nonpregnant women who are considered low risk for pelvic cancer and have no symptoms. Ask your health care provider if a screening pelvic exam is right for you.  If you have had past treatment for cervical cancer or a condition that could lead to cancer, you need Pap tests and screening for cancer for at least 20 years after your treatment. If Pap tests have been discontinued for you, your risk factors (such as having a new sexual partner) need to be reassessed to determine if you should start having screenings again. Some women have medical problems that increase the chance of getting cervical cancer. In these cases, your health care provider may recommend  that you have screening and Pap tests more often.  If you have a family history of uterine cancer or ovarian cancer, talk with your health care provider about genetic screening.  If you have vaginal bleeding after reaching menopause, tell your health care provider.  There are currently no reliable tests available to screen for ovarian cancer. Lung Cancer Lung cancer screening is recommended for adults 15-63 years old who are at high risk for lung cancer because of a history of smoking. A yearly low-dose CT scan of the lungs is recommended if you:  Currently smoke.  Have a history of at least 30 pack-years of smoking and you currently smoke or have quit within the past 15 years. A pack-year is smoking an average of one pack of cigarettes per day for one year. Yearly screening should:  Continue until it has been 15 years since you quit.  Stop if you develop a health problem that would prevent you from having lung cancer treatment. Colorectal Cancer  This type of cancer can be detected and can often be prevented.  Routine colorectal cancer screening usually begins at age 17 and continues through age 55.  If you have risk factors for colon cancer, your health care provider may recommend that you be screened at an earlier age.  If you have a family history of colorectal cancer, talk with your health care provider about genetic screening.  Your health care provider may also recommend using home test kits to check for hidden blood in your stool.  A small camera at the end of a tube can be used to examine your colon directly (sigmoidoscopy or colonoscopy). This is done to check for the earliest forms of colorectal cancer.  Direct examination of the colon should be repeated every 5-10 years until age 30. However, if early forms of precancerous polyps or small growths are found or if you have a family history or genetic risk for colorectal cancer, you may need to be screened more often. Skin  Cancer  Check your skin from head to toe regularly.  Monitor any moles. Be sure to tell your health care provider:  About any new moles or changes in moles, especially if there is a change in a mole's shape or color.  If you have a mole that is larger than the size of a pencil eraser.  If any of your family members has a history of skin cancer, especially at a young age, talk with your health care provider about genetic screening.  Always use sunscreen. Apply sunscreen liberally and repeatedly throughout the day.  Whenever you are outside, protect  yourself by wearing long sleeves, pants, a wide-brimmed hat, and sunglasses. WHAT SHOULD I KNOW ABOUT OSTEOPOROSIS? Osteoporosis is a condition in which bone destruction happens more quickly than new bone creation. After menopause, you may be at an increased risk for osteoporosis. To help prevent osteoporosis or the bone fractures that can happen because of osteoporosis, the following is recommended:  If you are 41-61 years old, get at least 1,000 mg of calcium and at least 600 mg of vitamin D per day.  If you are older than age 71 but younger than age 70, get at least 1,200 mg of calcium and at least 600 mg of vitamin D per day.  If you are older than age 31, get at least 1,200 mg of calcium and at least 800 mg of vitamin D per day. Smoking and excessive alcohol intake increase the risk of osteoporosis. Eat foods that are rich in calcium and vitamin D, and do weight-bearing exercises several times each week as directed by your health care provider. WHAT SHOULD I KNOW ABOUT HOW MENOPAUSE AFFECTS [place]? Depression may occur at any age, but it is more common as you become older. Common symptoms of depression include:  Low or sad mood.  Changes in sleep patterns.  Changes in appetite or eating patterns.  Feeling an overall lack of motivation or enjoyment of activities that you previously enjoyed.  Frequent crying spells. Talk  with your health care provider if you think that you are experiencing depression. WHAT SHOULD I KNOW ABOUT IMMUNIZATIONS? It is important that you get and maintain your immunizations. These include:  Tetanus, diphtheria, and pertussis (Tdap) booster vaccine.  Influenza every year before the flu season begins.  Pneumonia vaccine.  Shingles vaccine. Your health care provider may also recommend other immunizations.   This information is not intended to replace advice given to you by your health care provider. Make sure you discuss any questions you have with your health care provider.   Document Released: 08/26/2005 Document Revised: 07/25/2014 Document Reviewed: 03/06/2014 Elsevier Interactive Patient Education [employer].

## 2016-04-19 NOTE — Progress Notes (Signed)
Pre-visit discussion using our clinic review tool. No additional management support is needed unless otherwise documented below in the visit note.  

## 2016-04-19 NOTE — Progress Notes (Signed)
Patient ID: Penny Roberts, female    DOB: 1960/12/14  Age: 55 y.o. MRN: [id number]  The patient is here for annual wellness examination and management of other chronic and acute problems.   The risk factors are reflected in the social history.  The roster of all physicians providing medical care to patient - is listed in the Snapshot section of the chart.   Home safety : The patient has smoke detectors in the home. They wear seatbelts.  There are no firearms at home. There is no violence in the home.   There is no risks for hepatitis, STDs or HIV. There is no   history of blood transfusion. They have no travel history to infectious disease endemic areas of the world.  The patient has seen their dentist in the last six month. They have seen their eye doctor in the last year.    Discussed the need for sun protection: hats, long sleeves and use of sunscreen if there is significant sun exposure.   Diet: the importance of a healthy diet is discussed. They do have a healthy diet.  The benefits of regular aerobic exercise were discussed. She works out 3-4 times per week, has gained 6 lbs .   Depression screen: there are no signs or vegative symptoms of depression- irritability, change in appetite, anhedonia, sadness/tearfullness.   The following portions of the patient's history were reviewed and updated as appropriate: allergies, current medications, past family history, past medical history,  past surgical history, past social history  and problem list.  Visual acuity was not assessed per patient preference since she has regular follow up with her ophthalmologist. Hearing and body mass index were assessed and reviewed.   During the course of the visit the patient was educated and counseled about appropriate screening and preventive services including : fall prevention , diabetes screening, nutrition counseling, colorectal cancer screening, and recommended immunizations.    CC: The  primary encounter diagnosis was Skin neoplasm. Diagnoses of S/P hysterectomy with oophorectomy, Atrophic vaginitis, Breast cancer screening, Overweight, Vitamin D deficiency, Encounter for immunization, Overweight (BMI 25.0-29.9), and Encounter for preventive health examination were also pertinent to this visit.  Weight gain:  Occurred while renovating a house, frustrated at her continued weight gain despite exercising 3-4 times per week and following a healthy diet .  Diet and workout regimen  Reviewed.   History Penny has a past medical history of Allergy and Chicken pox.   She has a past surgical history that includes Tubal ligation; Removed benign tumor from chest -2011 (2011); Salpingoophorectomy; and Abdominal hysterectomy (2001).   Her family history includes Alcohol abuse in her father; Cancer in her father and maternal aunt; Heart disease in her mother; Hypertension in her father.She reports that she quit smoking about 11 years ago. She has never used smokeless tobacco. She reports that she drinks about 2.4 oz of alcohol per week . She reports that she does not use drugs.  Outpatient Medications Prior to Visit  Medication Sig Dispense Refill  . aspirin 81 MG tablet Take 81 mg by mouth daily.    . Multiple Vitamin (MULTIVITAMIN) capsule Take 1 capsule by mouth daily.    . calcium carbonate (OS-CAL) 600 MG TABS Take 600 mg by mouth daily.    . fish oil-omega-3 fatty acids 1000 MG capsule Take 1 g by mouth daily.     . fluticasone (FLONASE) 50 MCG/ACT nasal spray 2 sprays into each nostril daily (Patient not taking: Reported on 04/19/2016)  16 g 6  . olopatadine (PATANOL) 0.1 % ophthalmic solution Place 1 drop into both eyes 2 (two) times daily. (Patient not taking: Reported on 04/19/2016) 5 mL 12   No facility-administered medications prior to visit.     Review of Systems   Patient denies headache, fevers, malaise, unintentional weight loss, skin rash, eye pain, sinus congestion and  sinus pain, sore throat, dysphagia,  hemoptysis , cough, dyspnea, wheezing, chest pain, palpitations, orthopnea, edema, abdominal pain, nausea, melena, diarrhea, constipation, flank pain, dysuria, hematuria, urinary  Frequency, nocturia, numbness, tingling, seizures,  Focal weakness, Loss of consciousness,  Tremor, insomnia, depression, anxiety, and suicidal ideation.      Objective:  BP 122/76   Pulse 64   Temp 98.1 F (36.7 C) (Oral)   Resp 12   Ht 5' 6.5" (1.689 m)   Wt 185 lb 8 oz (84.1 kg)   SpO2 97%   BMI 29.49 kg/m   Physical Exam   General appearance: alert, cooperative and appears stated age Head: Normocephalic, without obvious abnormality, atraumatic Eyes: conjunctivae/corneas clear. PERRL, EOM's intact. Fundi benign. Ears: normal TM's and external ear canals both ears Nose: Nares normal. Septum midline. Mucosa normal. No drainage or sinus tenderness. Throat: lips, mucosa, and tongue normal; teeth and gums normal Neck: no adenopathy, no carotid bruit, no JVD, supple, symmetrical, trachea midline and thyroid not enlarged, symmetric, no tenderness/mass/nodules Lungs: clear to auscultation bilaterally Breasts: normal appearance, no masses or tenderness Heart: regular rate and rhythm, S1, S2 normal, no murmur, click, rub or gallop Abdomen: soft, non-tender; bowel sounds normal; no masses,  no organomegaly Extremities: extremities normal, atraumatic, no cyanosis or edema Pulses: 2+ and symmetric Skin: Skin color, texture, turgor normal. No rashes or lesions Neurologic: Alert and oriented X 3, normal strength and tone. Normal symmetric reflexes. Normal coordination and gait.    Assessment & Plan:   Problem List Items Addressed This Visit    Overweight (BMI 25.0-29.9)    I have addressed  BMI and recommended wt loss of 10% of body weigh over the next 6 months using a low glycemic index/mediterranean  diet and regular exercise a minimum of 5 days per week.         Encounter for preventive health examination    Annual comprehensive preventive exam was done as well as an evaluation and management of chronic conditions .  During the course of the visit the patient was educated and counseled about appropriate screening and preventive services including :  diabetes screening, lipid analysis with projected  10 year  risk for CAD , nutrition counseling, breast   and colorectal cancer screening, and recommended immunizations.  Printed recommendations for health maintenance screenings was given      Atrophic vaginitis    Trial of premarin cream.       S/P hysterectomy with oophorectomy    Other Visit Diagnoses    Skin neoplasm    -  Primary   Relevant Orders   Ambulatory referral to Dermatology   Breast cancer screening       Relevant Orders   MM DIGITAL SCREENING BILATERAL   Overweight       Relevant Orders   Comprehensive metabolic panel (Completed)   TSH (Completed)   Lipid panel (Completed)   Vitamin D deficiency       Relevant Orders   VITAMIN D 25 Hydroxy (Vit-D Deficiency, Fractures) (Completed)   Encounter for immunization       Relevant Orders   Flu Vaccine QUAD  36+ mos IM (Completed)     A total of 45 minutes was spent with patient more than half of which was spent in counseling patient on the above mentioned issues , reviewing and explaining recent labs and imaging studies done, and coordination of care. I have discontinued Ms. Roberts's fluticasone and olopatadine. I am also having her start on conjugated estrogens. Additionally, I am having her maintain her multivitamin, fish oil-omega-3 fatty acids, aspirin, and calcium carbonate.  Meds ordered this encounter  Medications  . conjugated estrogens (PREMARIN) vaginal cream    Sig: Place 1 Applicatorful vaginally daily. For  Two weeks ,  Then twice weekly thereafter    Dispense:  42.5 g    Refill:  12    Medications Discontinued During This Encounter  Medication Reason  . fluticasone  (FLONASE) 50 MCG/ACT nasal spray   . olopatadine (PATANOL) 0.1 % ophthalmic solution     Follow-up: No Follow-up on file.   Crecencio Mc, MD

## 2016-04-20 NOTE — Assessment & Plan Note (Signed)
Trial of premarin cream.

## 2016-04-20 NOTE — Assessment & Plan Note (Signed)
I have addressed  BMI and recommended wt loss of 10% of body weigh over the next 6 months using a low glycemic index/mediterranean  diet and regular exercise a minimum of 5 days per week.

## 2016-04-20 NOTE — Assessment & Plan Note (Signed)
Annual comprehensive preventive exam was done as well as an evaluation and management of chronic conditions .  During the course of the visit the patient was educated and counseled about appropriate screening and preventive services including :  diabetes screening, lipid analysis with projected  10 year  risk for CAD , nutrition counseling, breast   and colorectal cancer screening, and recommended immunizations.  Printed recommendations for health maintenance screenings was given

## 2016-05-04 ENCOUNTER — Other Ambulatory Visit: Payer: Self-pay | Admitting: Internal Medicine

## 2016-05-04 DIAGNOSIS — Z1231 Encounter for screening mammogram for malignant neoplasm of breast: Secondary | ICD-10-CM

## 2016-06-07 ENCOUNTER — Ambulatory Visit
Admission: RE | Admit: 2016-06-07 | Discharge: 2016-06-07 | Disposition: A | Discharge status: Home / Self Care | Payer: BC Managed Care – PPO | Source: Ambulatory Visit | Attending: Internal Medicine | Admitting: Internal Medicine

## 2016-06-07 DIAGNOSIS — Z1231 Encounter for screening mammogram for malignant neoplasm of breast: Secondary | ICD-10-CM | POA: Diagnosis not present

## 2016-06-07 HISTORY — DX: Malignant (primary) neoplasm, unspecified: C80.1

## 2017-05-31 ENCOUNTER — Other Ambulatory Visit: Payer: Self-pay | Admitting: Internal Medicine

## 2017-05-31 DIAGNOSIS — Z1239 Encounter for other screening for malignant neoplasm of breast: Secondary | ICD-10-CM

## 2017-06-20 ENCOUNTER — Encounter: Payer: Self-pay | Admitting: Internal Medicine

## 2017-06-20 ENCOUNTER — Ambulatory Visit (INDEPENDENT_AMBULATORY_CARE_PROVIDER_SITE_OTHER): Payer: BC Managed Care – PPO | Admitting: Internal Medicine

## 2017-06-20 VITALS — BP 100/66 | HR 62 | Temp 98.6°F | Resp 15 | Ht 66.5 in | Wt 167.0 lb

## 2017-06-20 DIAGNOSIS — Z0000 Encounter for general adult medical examination without abnormal findings: Secondary | ICD-10-CM | POA: Diagnosis not present

## 2017-06-20 DIAGNOSIS — Z23 Encounter for immunization: Secondary | ICD-10-CM

## 2017-06-20 DIAGNOSIS — E663 Overweight: Secondary | ICD-10-CM | POA: Diagnosis not present

## 2017-06-20 DIAGNOSIS — Z1239 Encounter for other screening for malignant neoplasm of breast: Secondary | ICD-10-CM

## 2017-06-20 DIAGNOSIS — Z1231 Encounter for screening mammogram for malignant neoplasm of breast: Secondary | ICD-10-CM | POA: Diagnosis not present

## 2017-06-20 LAB — COMPREHENSIVE METABOLIC PANEL
ALBUMIN: 4.4 g/dL (ref 3.5–5.2)
ALK PHOS: 56 U/L (ref 39–117)
ALT: 21 U/L (ref 0–35)
AST: 21 U/L (ref 0–37)
BILIRUBIN TOTAL: 0.7 mg/dL (ref 0.2–1.2)
BUN: 12 mg/dL (ref 6–23)
CO2: 32 mEq/L (ref 19–32)
Calcium: 9 mg/dL (ref 8.4–10.5)
Chloride: 102 mEq/L (ref 96–112)
Creatinine, Ser: 0.66 mg/dL (ref 0.40–1.20)
GFR: 98.61 mL/min (ref >60.00)
GLUCOSE: 87 mg/dL (ref 70–99)
Potassium: 4.2 mEq/L (ref 3.5–5.1)
Sodium: 140 mEq/L (ref 135–145)
TOTAL PROTEIN: 6.6 g/dL (ref 6.0–8.3)

## 2017-06-20 LAB — CBC WITH DIFFERENTIAL/PLATELET
BASOS PCT: 0.9 % (ref 0.0–3.0)
Basophils Absolute: 0 10*3/uL (ref 0.0–0.1)
Eosinophils Absolute: 0.2 10*3/uL (ref 0.0–0.7)
Eosinophils Relative: 3.2 % (ref 0.0–5.0)
HEMATOCRIT: 40.7 % (ref 36.0–46.0)
HEMOGLOBIN: 13.4 g/dL (ref 12.0–15.0)
LYMPHS PCT: 30.5 % (ref 12.0–46.0)
Lymphs Abs: 1.6 10*3/uL (ref 0.7–4.0)
MCHC: 32.9 g/dL (ref 30.0–36.0)
MCV: 91.9 fl (ref 78.0–100.0)
MONOS PCT: 7.3 % (ref 3.0–12.0)
Monocytes Absolute: 0.4 10*3/uL (ref 0.1–1.0)
NEUTROS ABS: 3.1 10*3/uL (ref 1.4–7.7)
Neutrophils Relative %: 58.1 % (ref 43.0–77.0)
PLATELETS: 231 10*3/uL (ref 150.0–400.0)
RBC: 4.43 Mil/uL (ref 3.87–5.11)
RDW: 14.5 % (ref 11.5–15.5)
WBC: 5.3 10*3/uL (ref 4.0–10.5)

## 2017-06-20 LAB — TSH: TSH: 0.99 u[IU]/mL (ref 0.35–4.50)

## 2017-06-20 LAB — LIPID PANEL
CHOLESTEROL: 253 mg/dL — AB (ref 0–200)
HDL: 82.5 mg/dL (ref >39.00)
LDL Cholesterol: 150 mg/dL — ABNORMAL HIGH (ref 0–99)
NONHDL: 170.46
TRIGLYCERIDES: 101 mg/dL (ref 0.0–149.0)
Total CHOL/HDL Ratio: 3
VLDL: 20.2 mg/dL (ref 0.0–40.0)

## 2017-06-20 MED ORDER — ZOSTER VAC RECOMB ADJUVANTED 50 MCG/0.5ML IM SUSR: 0.5000 mL | Freq: Once | INTRAMUSCULAR | 1 refills | Status: AC

## 2017-06-20 NOTE — Patient Instructions (Addendum)
You look fantastic !   I agree with your plan  I have ordered your mammogram    The ShingRx vaccine is now available in local pharmacies and is much more protective thant Zostavaxs,  It is therefore ADVISED for all interested adults over 50 to prevent shingles   Health Maintenance for Postmenopausal Women Menopause is a normal process in which your reproductive ability comes to an end. This process happens gradually over a span of months to years, usually between the ages of 31 and 21. Menopause is complete when you have missed 12 consecutive menstrual periods. It is important to talk with your health care provider about some of the most common conditions that affect postmenopausal women, such as heart disease, cancer, and bone loss (osteoporosis). Adopting a healthy lifestyle and getting preventive care can help to promote your health and wellness. Those actions can also lower your chances of developing some of these common conditions. What should I know about menopause? During menopause, you may experience a number of symptoms, such as:  Moderate-to-severe hot flashes.  Night sweats.  Decrease in sex drive.  Mood swings.  Headaches.  Tiredness.  Irritability.  Memory problems.  Insomnia.  Choosing to treat or not to treat menopausal changes is an individual decision that you make with your health care provider. What should I know about hormone replacement therapy and supplements? Hormone therapy products are effective for treating symptoms that are associated with menopause, such as hot flashes and night sweats. Hormone replacement carries certain risks, especially as you become older. If you are thinking about using estrogen or estrogen with progestin treatments, discuss the benefits and risks with your health care provider. What should I know about heart disease and stroke? Heart disease, heart attack, and stroke become more likely as you age. This may be due, in part, to the  hormonal changes that your body experiences during menopause. These can affect how your body processes dietary fats, triglycerides, and cholesterol. Heart attack and stroke are both medical emergencies. There are many things that you can do to help prevent heart disease and stroke:  Have your blood pressure checked at least every 1-2 years. High blood pressure causes heart disease and increases the risk of stroke.  If you are 32-10 years old, ask your health care provider if you should take aspirin to prevent a heart attack or a stroke.  Do not use any tobacco products, including cigarettes, chewing tobacco, or electronic cigarettes. If you need help quitting, ask your health care provider.  It is important to eat a healthy diet and maintain a healthy weight. ? Be sure to include plenty of vegetables, fruits, low-fat dairy products, and lean protein. ? Avoid eating foods that are high in solid fats, added sugars, or salt (sodium).  Get regular exercise. This is one of the most important things that you can do for your health. ? Try to exercise for at least 150 minutes each week. The type of exercise that you do should increase your heart rate and make you sweat. This is known as moderate-intensity exercise. ? Try to do strengthening exercises at least twice each week. Do these in addition to the moderate-intensity exercise.  Know your numbers.Ask your health care provider to check your cholesterol and your blood glucose. Continue to have your blood tested as directed by your health care provider.  What should I know about cancer screening? There are several types of cancer. Take the following steps to reduce your risk and  to catch any cancer development as early as possible. Breast Cancer  Practice breast self-awareness. ? This means understanding how your breasts normally appear and feel. ? It also means doing regular breast self-exams. Let your health care provider know about any changes,  no matter how small.  If you are 7 or older, have a clinician do a breast exam (clinical breast exam or CBE) every year. Depending on your age, family history, and medical history, it may be recommended that you also have a yearly breast X-ray (mammogram).  If you have a family history of breast cancer, talk with your health care provider about genetic screening.  If you are at high risk for breast cancer, talk with your health care provider about having an MRI and a mammogram every year.  Breast cancer (BRCA) gene test is recommended for women who have family members with BRCA-related cancers. Results of the assessment will determine the need for genetic counseling and BRCA1 and for BRCA2 testing. BRCA-related cancers include these types: ? Breast. This occurs in males or females. ? Ovarian. ? Tubal. This may also be called fallopian tube cancer. ? Cancer of the abdominal or pelvic lining (peritoneal cancer). ? Prostate. ? Pancreatic.  Cervical, Uterine, and Ovarian Cancer Your health care provider may recommend that you be screened regularly for cancer of the pelvic organs. These include your ovaries, uterus, and vagina. This screening involves a pelvic exam, which includes checking for microscopic changes to the surface of your cervix (Pap test).  For women ages 21-65, health care providers may recommend a pelvic exam and a Pap test every three years. For women ages 73-65, they may recommend the Pap test and pelvic exam, combined with testing for human papilloma virus (HPV), every five years. Some types of HPV increase your risk of cervical cancer. Testing for HPV may also be done on women of any age who have unclear Pap test results.  Other health care providers may not recommend any screening for nonpregnant women who are considered low risk for pelvic cancer and have no symptoms. Ask your health care provider if a screening pelvic exam is right for you.  If you have had past treatment  for cervical cancer or a condition that could lead to cancer, you need Pap tests and screening for cancer for at least 20 years after your treatment. If Pap tests have been discontinued for you, your risk factors (such as having a new sexual partner) need to be reassessed to determine if you should start having screenings again. Some women have medical problems that increase the chance of getting cervical cancer. In these cases, your health care provider may recommend that you have screening and Pap tests more often.  If you have a family history of uterine cancer or ovarian cancer, talk with your health care provider about genetic screening.  If you have vaginal bleeding after reaching menopause, tell your health care provider.  There are currently no reliable tests available to screen for ovarian cancer.  Lung Cancer Lung cancer screening is recommended for adults 52-50 years old who are at high risk for lung cancer because of a history of smoking. A yearly low-dose CT scan of the lungs is recommended if you:  Currently smoke.  Have a history of at least 30 pack-years of smoking and you currently smoke or have quit within the past 15 years. A pack-year is smoking an average of one pack of cigarettes per day for one year.  Yearly screening should:  Continue until it has been 15 years since you quit.  Stop if you develop a health problem that would prevent you from having lung cancer treatment.  Colorectal Cancer  This type of cancer can be detected and can often be prevented.  Routine colorectal cancer screening usually begins at age 79 and continues through age 38.  If you have risk factors for colon cancer, your health care provider may recommend that you be screened at an earlier age.  If you have a family history of colorectal cancer, talk with your health care provider about genetic screening.  Your health care provider may also recommend using home test kits to check for hidden  blood in your stool.  A small camera at the end of a tube can be used to examine your colon directly (sigmoidoscopy or colonoscopy). This is done to check for the earliest forms of colorectal cancer.  Direct examination of the colon should be repeated every 5-10 years until age 15. However, if early forms of precancerous polyps or small growths are found or if you have a family history or genetic risk for colorectal cancer, you may need to be screened more often.  Skin Cancer  Check your skin from head to toe regularly.  Monitor any moles. Be sure to tell your health care provider: ? About any new moles or changes in moles, especially if there is a change in a mole's shape or color. ? If you have a mole that is larger than the size of a pencil eraser.  If any of your family members has a history of skin cancer, especially at a young age, talk with your health care provider about genetic screening.  Always use sunscreen. Apply sunscreen liberally and repeatedly throughout the day.  Whenever you are outside, protect yourself by wearing long sleeves, pants, a wide-brimmed hat, and sunglasses.  What should I know about osteoporosis? Osteoporosis is a condition in which bone destruction happens more quickly than new bone creation. After menopause, you may be at an increased risk for osteoporosis. To help prevent osteoporosis or the bone fractures that can happen because of osteoporosis, the following is recommended:  If you are 42-2 years old, get at least 1,000 mg of calcium and at least 600 mg of vitamin D per day.  If you are older than age 22 but younger than age 58, get at least 1,200 mg of calcium and at least 600 mg of vitamin D per day.  If you are older than age 66, get at least 1,200 mg of calcium and at least 800 mg of vitamin D per day.  Smoking and excessive alcohol intake increase the risk of osteoporosis. Eat foods that are rich in calcium and vitamin D, and do weight-bearing  exercises several times each week as directed by your health care provider. What should I know about how menopause affects my mental health? Depression may occur at any age, but it is more common as you become older. Common symptoms of depression include:  Low or sad mood.  Changes in sleep patterns.  Changes in appetite or eating patterns.  Feeling an overall lack of motivation or enjoyment of activities that you previously enjoyed.  Frequent crying spells.  Talk with your health care provider if you think that you are experiencing depression. What should I know about immunizations? It is important that you get and maintain your immunizations. These include:  Tetanus, diphtheria, and pertussis (Tdap) booster vaccine.  Influenza every year before the  flu season begins.  Pneumonia vaccine.  Shingles vaccine.  Your health care provider may also recommend other immunizations. This information is not intended to replace advice given to you by your health care provider. Make sure you discuss any questions you have with your health care provider. Document Released: 08/26/2005 Document Revised: 01/22/2016 Document Reviewed: 04/07/2015 Elsevier Interactive Patient Education  2018 [employer].

## 2017-06-20 NOTE — Progress Notes (Signed)
Patient ID: Penny Roberts, female    DOB: October 14, 1960  Age: 56 y.o. MRN: [id number]  The patient is here for annual  preventive examination and management of other chronic and acute problems.   The risk factors are reflected in the social history.  The roster of all physicians providing medical care to patient - is listed in the Snapshot section of the chart.  Activities of daily living:  The patient is 100% independent in all ADLs: dressing, toileting, feeding as well as independent mobility  Home safety : The patient has smoke detectors in the home. They wear seatbelts.  There are no firearms at home. There is no violence in the home.   There is no risks for hepatitis, STDs or HIV. There is no   history of blood transfusion. They have no travel history to infectious disease endemic areas of the world.  The patient has seen their dentist in the last six month. They have seen their eye doctor in the last year. They admit to slight hearing difficulty with regard to whispered voices and some television programs.  They have deferred audiologic testing in the last year.  They do not  have excessive sun exposure. Discussed the need for sun protection: hats, long sleeves and use of sunscreen if there is significant sun exposure.   Diet: the importance of a healthy diet is discussed. She is following a low carb , low fat diet and participating in weight Watchers program .  The benefits of regular aerobic exercise were discussed. She works out 5 days per week 60 mintues .   Depression screen: there are no signs or vegative symptoms of depression- irritability, change in appetite, anhedonia, sadness/tearfullness.  The following portions of the patient's history were reviewed and updated as appropriate: allergies, current medications, past family history, past medical history,  past surgical history, past social history  and problem list.  Visual acuity was not assessed per patient preference since  she has regular follow up with her ophthalmologist. Hearing and body mass index were assessed and reviewed.   During the course of the visit the patient was educated and counseled about appropriate screening and preventive services including : fall prevention , diabetes screening, nutrition counseling, colorectal cancer screening, and recommended immunizations.    CC: The primary encounter diagnosis was Breast cancer screening. Diagnoses of Need for immunization against influenza, Encounter for preventive health examination, and Overweight (BMI 25.0-29.9) were also pertinent to this visit.  Has lost 21 lbs by going to weight watchers,,  Had not lost weight despite working out regularly goal is 145 lb ( BMI 23 ).  Limiting fat and carbs.    Skin cancers (2) taken off since last year.    No  acute  issues.   History Ravleen has a past medical history of Allergy, Cancer ([place]), and Chicken pox.   She has a past surgical history that includes Tubal ligation; Removed benign tumor from chest -2011 (2011); Salpingoophorectomy; and Abdominal hysterectomy (2001).   Her family history includes Alcohol abuse in her father; Breast cancer in her maternal aunt; Breast cancer (age of onset: 66) in her mother; Cancer in her father and maternal aunt; Heart disease in her mother; Hypertension in her father.She reports that she quit smoking about 13 years ago. she has never used smokeless tobacco. She reports that she drinks about 2.4 oz of alcohol per week. She reports that she does not use drugs.  Outpatient Medications Prior to Visit  Medication Sig  Dispense Refill  . calcium carbonate (OS-CAL) 600 MG TABS Take 600 mg by mouth daily.    . fish oil-omega-3 fatty acids 1000 MG capsule Take 1 g by mouth daily.     Marland Kitchen MELATONIN PO Take 1 tablet by mouth daily.    . Multiple Vitamin (MULTIVITAMIN) capsule Take 1 capsule by mouth daily.    Marland Kitchen aspirin 81 MG tablet Take 81 mg by mouth daily.    Marland Kitchen conjugated estrogens  (PREMARIN) vaginal cream Place 1 Applicatorful vaginally daily. For  Two weeks ,  Then twice weekly thereafter (Patient not taking: Reported on 06/20/2017) 42.5 g 12   No facility-administered medications prior to visit.     Review of Systems   Patient denies headache, fevers, malaise, unintentional weight loss, skin rash, eye pain, sinus congestion and sinus pain, sore throat, dysphagia,  hemoptysis , cough, dyspnea, wheezing, chest pain, palpitations, orthopnea, edema, abdominal pain, nausea, melena, diarrhea, constipation, flank pain, dysuria, hematuria, urinary  Frequency, nocturia, numbness, tingling, seizures,  Focal weakness, Loss of consciousness,  Tremor, insomnia, depression, anxiety, and suicidal ideation.      Objective:  BP 100/66 (BP Location: Left Arm, Patient Position: Sitting, Cuff Size: Normal)   Pulse 62   Temp 98.6 F (37 C) (Oral)   Resp 15   Ht 5' 6.5" (1.689 m)   Wt 167 lb (75.8 kg)   SpO2 98%   BMI 26.55 kg/m    Physical Exam  General appearance: alert, cooperative and appears stated age Head: Normocephalic, without obvious abnormality, atraumatic Eyes: conjunctivae/corneas clear. PERRL, EOM's intact. Fundi benign. Ears: normal TM's and external ear canals both ears Nose: Nares normal. Septum midline. Mucosa normal. No drainage or sinus tenderness. Throat: lips, mucosa, and tongue normal; teeth and gums normal Neck: no adenopathy, no carotid bruit, no JVD, supple, symmetrical, trachea midline and thyroid not enlarged, symmetric, no tenderness/mass/nodules Lungs: clear to auscultation bilaterally Breasts: normal appearance, no masses or tenderness Heart: regular rate and rhythm, S1, S2 normal, no murmur, click, rub or gallop Abdomen: soft, non-tender; bowel sounds normal; no masses,  no organomegaly Extremities: extremities normal, atraumatic, no cyanosis or edema Pulses: 2+ and symmetric Skin: Skin color, texture, turgor normal. No rashes or  lesions Neurologic: Alert and oriented X 3, normal strength and tone. Normal symmetric reflexes. Normal coordination and gait.    Assessment & Plan:   Problem List Items Addressed This Visit    Encounter for preventive health examination    Annual comprehensive preventive exam was done .  During the course of the visit the patient was educated and counseled about appropriate screening and preventive services including :  diabetes and thyroid  screening, lipid analysis with projected  10 year  risk for CAD  Which is 2.7 % using the Framingham risk calculator for women, , nutrition counseling, colorectal cancer screening, and recommended immunizations.  Printed recommendations for health maintenance screenings was given.   No results found for: HGBA1C Lab Results  Component Value Date   CHOL 253 (H) 06/20/2017   HDL 82.50 06/20/2017   LDLCALC 150 (H) 06/20/2017   LDLDIRECT 116.4 09/17/2012   TRIG 101.0 06/20/2017   CHOLHDL 3 06/20/2017   Lab Results  Component Value Date   TSH 0.99 06/20/2017   Lab Results  Component Value Date   WBC 5.3 06/20/2017   HGB 13.4 06/20/2017   HCT 40.7 06/20/2017   MCV 91.9 06/20/2017   PLT 231.0 06/20/2017  Relevant Orders   Lipid panel (Completed)   Comprehensive metabolic panel (Completed)   TSH (Completed)   CBC with Differential/Platelet (Completed)   Overweight (BMI 25.0-29.9)    I have congratulated her in reduction of   BMI and encouraged  Continued weight loss with goal of 10% of body weigh over the next 6 months using a low glycemic index diet and regular exercise a minimum of 5 days per week.         Other Visit Diagnoses    Breast cancer screening    -  Primary   Relevant Orders   MM SCREENING BREAST TOMO BILATERAL   Need for immunization against influenza       Relevant Orders   Flu Vaccine QUAD 36+ mos IM (Completed)      I have discontinued Mikel B. Schuyler's aspirin and conjugated estrogens. I am also having her  start on Zoster Vaccine Adjuvanted. Additionally, I am having her maintain her multivitamin, fish oil-omega-3 fatty acids, calcium carbonate, and MELATONIN PO.  Meds ordered this encounter  Medications  . Zoster Vaccine Adjuvanted [facility]) injection    Sig: Inject 0.5 mLs into the muscle once for 1 dose.    Dispense:  1 each    Refill:  1    Medications Discontinued During This Encounter  Medication Reason  . aspirin 81 MG tablet Patient has not taken in last 30 days  . conjugated estrogens (PREMARIN) vaginal cream Patient has not taken in last 30 days    Follow-up: Return in about 1 year (around 06/20/2018).   Crecencio Mc, MD

## 2017-06-21 ENCOUNTER — Encounter: Payer: Self-pay | Admitting: Internal Medicine

## 2017-06-21 NOTE — Assessment & Plan Note (Signed)
I have congratulated her in reduction of   BMI and encouraged  Continued weight loss with goal of 10% of body weigh over the next 6 months using a low glycemic index diet and regular exercise a minimum of 5 days per week.    

## 2017-06-21 NOTE — Assessment & Plan Note (Addendum)
Annual comprehensive preventive exam was done .  During the course of the visit the patient was educated and counseled about appropriate screening and preventive services including :  diabetes and thyroid  screening, lipid analysis with projected  10 year  risk for CAD  Which is 2.7 % using the Framingham risk calculator for women, , nutrition counseling, colorectal cancer screening, and recommended immunizations.  Printed recommendations for health maintenance screenings was given.   No results found for: HGBA1C Lab Results  Component Value Date   CHOL 253 (H) 06/20/2017   HDL 82.50 06/20/2017   LDLCALC 150 (H) 06/20/2017   LDLDIRECT 116.4 09/17/2012   TRIG 101.0 06/20/2017   CHOLHDL 3 06/20/2017   Lab Results  Component Value Date   TSH 0.99 06/20/2017   Lab Results  Component Value Date   WBC 5.3 06/20/2017   HGB 13.4 06/20/2017   HCT 40.7 06/20/2017   MCV 91.9 06/20/2017   PLT 231.0 06/20/2017

## 2017-07-30 ENCOUNTER — Encounter: Payer: Self-pay | Admitting: Internal Medicine

## 2017-07-30 DIAGNOSIS — Z800 Family history of malignant neoplasm of digestive organs: Secondary | ICD-10-CM

## 2017-08-14 ENCOUNTER — Telehealth: Payer: Self-pay | Admitting: Gastroenterology

## 2017-08-14 ENCOUNTER — Other Ambulatory Visit: Payer: Self-pay

## 2017-08-14 DIAGNOSIS — Z1211 Encounter for screening for malignant neoplasm of colon: Secondary | ICD-10-CM

## 2017-08-14 NOTE — Telephone Encounter (Signed)
PATIENT CALLED RETURNING CALL TO SET UP COLONOSCOPY WITH DR WOHL,REF'D BY DR Derrel Nip. PATIENT IS IN  REFERRALS.

## 2017-08-14 NOTE — Telephone Encounter (Signed)
Gastroenterology Pre-Procedure Review  Request Date:  Requesting Physician: Dr.   PATIENT REVIEW QUESTIONS: The patient responded to the following health history questions as indicated:    1. Are you having any GI issues? no 2. Do you have a personal history of Polyps? no 3. Do you have a family history of Colon Cancer or Polyps? yes (Father, colon Brother, colon cancer) 4. Diabetes Mellitus? no 5. Joint replacements in the past 12 months?no 6. Major health problems in the past 3 months?no 7. Any artificial heart valves, MVP, or defibrillator?no    MEDICATIONS & ALLERGIES:    Patient reports the following regarding taking any anticoagulation/antiplatelet therapy:   Plavix, Coumadin, Eliquis, Xarelto, Lovenox, Pradaxa, Brilinta, or Effient? no Aspirin? no  Patient confirms/reports the following medications:  Current Outpatient Medications  Medication Sig Dispense Refill  . calcium carbonate (OS-CAL) 600 MG TABS Take 600 mg by mouth daily.    . fish oil-omega-3 fatty acids 1000 MG capsule Take 1 g by mouth daily.     Marland Kitchen MELATONIN PO Take 1 tablet by mouth daily.    . Multiple Vitamin (MULTIVITAMIN) capsule Take 1 capsule by mouth daily.     No current facility-administered medications for this visit.     Patient confirms/reports the following allergies:  No Known Allergies  No orders of the defined types were placed in this encounter.   AUTHORIZATION INFORMATION Primary Insurance: 1D#: Group #:  Secondary Insurance: 1D#: Group #:  SCHEDULE INFORMATION: Date: 09/08/17 Time: Location: [place]

## 2017-08-15 ENCOUNTER — Other Ambulatory Visit: Payer: Self-pay | Admitting: Internal Medicine

## 2017-08-15 ENCOUNTER — Ambulatory Visit
Admission: RE | Admit: 2017-08-15 | Discharge: 2017-08-15 | Disposition: A | Discharge status: Home / Self Care | Payer: BC Managed Care – PPO | Source: Ambulatory Visit | Attending: Internal Medicine | Admitting: Internal Medicine

## 2017-08-15 DIAGNOSIS — Z1239 Encounter for other screening for malignant neoplasm of breast: Secondary | ICD-10-CM

## 2017-08-15 DIAGNOSIS — Z1231 Encounter for screening mammogram for malignant neoplasm of breast: Secondary | ICD-10-CM | POA: Diagnosis not present

## 2017-08-28 ENCOUNTER — Encounter: Payer: Self-pay | Admitting: Internal Medicine

## 2017-08-28 ENCOUNTER — Other Ambulatory Visit: Payer: Self-pay | Admitting: Internal Medicine

## 2017-08-28 ENCOUNTER — Ambulatory Visit: Payer: BC Managed Care – PPO | Admitting: Internal Medicine

## 2017-08-28 ENCOUNTER — Ambulatory Visit (INDEPENDENT_AMBULATORY_CARE_PROVIDER_SITE_OTHER): Payer: BC Managed Care – PPO

## 2017-08-28 VITALS — BP 112/66 | HR 63 | Temp 98.0°F | Resp 15 | Ht 66.5 in | Wt 165.6 lb

## 2017-08-28 DIAGNOSIS — J209 Acute bronchitis, unspecified: Secondary | ICD-10-CM | POA: Diagnosis not present

## 2017-08-28 DIAGNOSIS — R05 Cough: Secondary | ICD-10-CM

## 2017-08-28 DIAGNOSIS — J40 Bronchitis, not specified as acute or chronic: Secondary | ICD-10-CM | POA: Diagnosis not present

## 2017-08-28 DIAGNOSIS — R058 Other specified cough: Secondary | ICD-10-CM

## 2017-08-28 MED ORDER — PREDNISONE 10 MG PO TABS: ORAL_TABLET | ORAL | 0 refills | Status: DC

## 2017-08-28 MED ORDER — GUAIFENESIN-CODEINE 100-10 MG/5ML PO SYRP: 5.0000 mL | ORAL_SOLUTION | Freq: Every evening | ORAL | 0 refills | Status: DC | PRN

## 2017-08-28 MED ORDER — AMOXICILLIN-POT CLAVULANATE 875-125 MG PO TABS
1.0000 | ORAL_TABLET | Freq: Two times a day (BID) | ORAL | 0 refills | Status: DC
Start: 2017-08-28 — End: 2018-06-21

## 2017-08-28 MED ORDER — BENZONATATE 200 MG PO CAPS: 200.0000 mg | ORAL_CAPSULE | Freq: Two times a day (BID) | ORAL | 0 refills | Status: DC | PRN

## 2017-08-28 NOTE — Telephone Encounter (Signed)
LMTCB. Please transfer pt to our office.  

## 2017-08-28 NOTE — Telephone Encounter (Signed)
Patient scheduled.

## 2017-08-28 NOTE — Telephone Encounter (Signed)
Can you please schedule pt for acute visit for bronchitis at 4:15pm today. Pt is aware of appt and knows that she needs to go down to lab for chest xray as soon as she gets here.

## 2017-08-28 NOTE — Patient Instructions (Signed)
I am prescribing you a prednisone taper, a 7 day course of augmentin ,  And a daytime and nighttime cough suppressant.   You can  use either sudafed PE/afrin for nasal congestion or Afrin nasal spray twice daily for 5 days for the ear and sinus congestion   I also recommending using Simply Saline nasal spray twice daily to flush your sinuses out. (available OTC)  Taking an antibiotic can create an imbalance in the normal population of bacteria that live in the small intestine.  This imbalance can persist for 3 months.   Taking a probiotic ( Align, Floraque or Culturelle), the generic version of one of these over the counter medications, or an alternative form (kombucha,  Yogurt, or another dietary source) for a minimum of 3 weeks may help prevent a serious antibiotic associated diarrhea  Called clostridium dificile colitis that occurs when the bacteria population is altered .  Taking a probiotic may also prevent vaginitis due to yeast infections and can be continued indefinitely if you feel that it improves your digestion or your elimination (bowels).

## 2017-08-28 NOTE — Progress Notes (Signed)
Subjective:  Patient ID: Penny Roberts, female    DOB: Jul 26, 1960  Age: 57 y.o. MRN: [id number]  CC: The primary encounter diagnosis was Bronchitis. A diagnosis of Acute bronchitis, unspecified organism was also pertinent to this visit.  HPI Penny Roberts presents for treatment of bronchitis .  Patient has been having Cough and nasal congestion x 1 week .  She denies  flu like symptoms.   No fevers or body aches but has been having  Purulent drainage.  And now  Laryngitis     Outpatient Medications Prior to Visit  Medication Sig Dispense Refill  . calcium carbonate (OS-CAL) 600 MG TABS Take 600 mg by mouth daily.    . fish oil-omega-3 fatty acids 1000 MG capsule Take 1 g by mouth daily.     Marland Kitchen MELATONIN PO Take 1 tablet by mouth daily.    . Multiple Vitamin (MULTIVITAMIN) capsule Take 1 capsule by mouth daily.     No facility-administered medications prior to visit.     Review of Systems;  Patient denies headache, fevers, malaise, unintentional weight loss, skin rash, eye pain, sinus congestion and sinus pain, sore throat, dysphagia,  hemoptysis , cough, dyspnea, wheezing, chest pain, palpitations, orthopnea, edema, abdominal pain, nausea, melena, diarrhea, constipation, flank pain, dysuria, hematuria, urinary  Frequency, nocturia, numbness, tingling, seizures,  Focal weakness, Loss of consciousness,  Tremor, insomnia, depression, anxiety, and suicidal ideation.      Objective:  BP 112/66 (BP Location: Left Arm, Patient Position: Sitting, Cuff Size: Normal)   Pulse 63   Temp 98 F (36.7 C) (Oral)   Resp 15   Ht 5' 6.5" (1.689 m)   Wt 165 lb 9.6 oz (75.1 kg)   SpO2 98%   BMI 26.33 kg/m   BP Readings from Last 3 Encounters:  08/28/17 112/66  06/20/17 100/66  04/19/16 122/76    Wt Readings from Last 3 Encounters:  08/28/17 165 lb 9.6 oz (75.1 kg)  06/20/17 167 lb (75.8 kg)  04/19/16 185 lb 8 oz (84.1 kg)    General appearance: alert, cooperative and  appears stated age Ears: bilateral erythema without bulging.  Throat: lips, mucosa, and tongue normal; teeth and gums normal Neck: no adenopathy, no carotid bruit, supple, symmetrical, trachea midline and thyroid not enlarged, symmetric, no tenderness/mass/nodules Back: symmetric, no curvature. ROM normal. No CVA tenderness. Lungs: clear to auscultation bilaterally Heart: regular rate and rhythm, S1, S2 normal, no murmur, click, rub or gallop Abdomen: soft, non-tender; bowel sounds normal; no masses,  no organomegaly Pulses: 2+ and symmetric Skin: Skin color, texture, turgor normal. No rashes or lesions Lymph nodes: Cervical, supraclavicular, and axillary nodes normal.  No results found for: HGBA1C  Lab Results  Component Value Date   CREATININE 0.66 06/20/2017   CREATININE 0.75 04/19/2016   CREATININE 0.73 09/16/2014    Lab Results  Component Value Date   WBC 5.3 06/20/2017   HGB 13.4 06/20/2017   HCT 40.7 06/20/2017   PLT 231.0 06/20/2017   GLUCOSE 87 06/20/2017   CHOL 253 (H) 06/20/2017   TRIG 101.0 06/20/2017   HDL 82.50 06/20/2017   LDLDIRECT 116.4 09/17/2012   LDLCALC 150 (H) 06/20/2017   ALT 21 06/20/2017   AST 21 06/20/2017   NA 140 06/20/2017   K 4.2 06/20/2017   CL 102 06/20/2017   CREATININE 0.66 06/20/2017   BUN 12 06/20/2017   CO2 32 06/20/2017   TSH 0.99 06/20/2017    Mm Screening Breast Tomo Bilateral  Result Date: 08/15/2017 CLINICAL DATA:  Screening. EXAM: 2D DIGITAL SCREENING BILATERAL MAMMOGRAM WITH 3D TOMO WITH CAD COMPARISON:  Previous exam(s). ACR Breast Density Category b: There are scattered areas of fibroglandular density. FINDINGS: There are no findings suspicious for malignancy. Images were processed with CAD. IMPRESSION: No mammographic evidence of malignancy. A result letter of this screening mammogram will be mailed directly to the patient. RECOMMENDATION: Screening mammogram in one year. (Code:SM-B-01Y) BI-RADS CATEGORY  1: Negative.  Electronically Signed   By: Curlene Dolphin M.D.   On: 08/15/2017 15:24    Assessment & Plan:   Problem List Items Addressed This Visit    Bronchitis, acute    With sinusitis, otitis and chest tightness,  Chest x ray normla.  Empiric abx cough suppressants and prednisone taper. Probiotics advise.d        Other Visit Diagnoses    Bronchitis    -  Primary   Relevant Medications   guaiFENesin-codeine (CHERATUSSIN AC) 100-10 MG/5ML syrup      I am having Penny Roberts start on amoxicillin-clavulanate, predniSONE, guaiFENesin-codeine, and benzonatate. I am also having her maintain her multivitamin, fish oil-omega-3 fatty acids, calcium carbonate, and MELATONIN PO.  Meds ordered this encounter  Medications  . amoxicillin-clavulanate (AUGMENTIN) 875-125 MG tablet    Sig: Take 1 tablet by mouth 2 (two) times daily.    Dispense:  14 tablet    Refill:  0  . predniSONE (DELTASONE) 10 MG tablet    Sig: 6 tablets on Day 1 , then reduce by 1 tablet daily until gone    Dispense:  21 tablet    Refill:  0  . guaiFENesin-codeine (CHERATUSSIN AC) 100-10 MG/5ML syrup    Sig: Take 5 mLs by mouth at bedtime as needed and may repeat dose one time if needed for cough.    Dispense:  118 mL    Refill:  0  . benzonatate (TESSALON) 200 MG capsule    Sig: Take 1 capsule (200 mg total) by mouth 2 (two) times daily as needed for cough.    Dispense:  20 capsule    Refill:  0    There are no discontinued medications.  Follow-up: No Follow-up on file.   Crecencio Mc, MD

## 2017-08-29 DIAGNOSIS — J209 Acute bronchitis, unspecified: Secondary | ICD-10-CM | POA: Insufficient documentation

## 2017-08-29 NOTE — Assessment & Plan Note (Signed)
With sinusitis, otitis and chest tightness,  Chest x ray normla.  Empiric abx cough suppressants and prednisone taper. Probiotics advise.d

## 2017-09-04 NOTE — Discharge Instructions (Signed)
General Anesthesia, Adult, Care After °These instructions provide you with information about caring for yourself after your procedure. Your health care provider may also give you more specific instructions. Your treatment has been planned according to current medical practices, but problems sometimes occur. Call your health care provider if you have any problems or questions after your procedure. °What can I expect after the procedure? °After the procedure, it is common to have: °· Vomiting. °· A sore throat. °· Mental slowness. ° °It is common to feel: °· Nauseous. °· Cold or shivery. °· Sleepy. °· Tired. °· Sore or achy, even in parts of your body where you did not have surgery. ° °Follow these instructions at home: °For at least 24 hours after the procedure: °· Do not: °? Participate in activities where you could fall or become injured. °? Drive. °? Use heavy machinery. °? Drink alcohol. °? Take sleeping pills or medicines that cause drowsiness. °? Make important decisions or sign legal documents. °? Take care of children on your own. °· Rest. °Eating and drinking °· If you vomit, drink water, juice, or soup when you can drink without vomiting. °· Drink enough fluid to keep your urine clear or pale yellow. °· Make sure you have little or no nausea before eating solid foods. °· Follow the diet recommended by your health care provider. °General instructions °· Have a responsible adult stay with you until you are awake and alert. °· Return to your normal activities as told by your health care provider. Ask your health care provider what activities are safe for you. °· Take over-the-counter and prescription medicines only as told by your health care provider. °· If you smoke, do not smoke without supervision. °· Keep all follow-up visits as told by your health care provider. This is important. °Contact a health care provider if: °· You continue to have nausea or vomiting at home, and medicines are not helpful. °· You  cannot drink fluids or start eating again. °· You cannot urinate after 8-12 hours. °· You develop a skin rash. °· You have fever. °· You have increasing redness at the site of your procedure. °Get help right away if: °· You have difficulty breathing. °· You have chest pain. °· You have unexpected bleeding. °· You feel that you are having a life-threatening or urgent problem. °This information is not intended to replace advice given to you by your health care provider. Make sure you discuss any questions you have with your health care provider. °Document Released: 10/10/2000 Document Revised: 12/07/2015 Document Reviewed: 06/18/2015 °Elsevier Interactive Patient Education © 2018 [employer] ° °

## 2017-09-08 ENCOUNTER — Ambulatory Visit
Admission: RE | Admit: 2017-09-08 | Discharge: 2017-09-08 | Disposition: A | Discharge status: Home / Self Care | Payer: BC Managed Care – PPO | Source: Ambulatory Visit | Attending: Gastroenterology | Admitting: Gastroenterology

## 2017-09-08 ENCOUNTER — Ambulatory Visit: Payer: BC Managed Care – PPO | Admitting: Anesthesiology

## 2017-09-08 ENCOUNTER — Encounter
Admission: RE | Disposition: A | Discharge status: Home / Self Care | Payer: Self-pay | Source: Ambulatory Visit | Attending: Gastroenterology

## 2017-09-08 DIAGNOSIS — Z79899 Other long term (current) drug therapy: Secondary | ICD-10-CM | POA: Diagnosis not present

## 2017-09-08 DIAGNOSIS — K5730 Diverticulosis of large intestine without perforation or abscess without bleeding: Secondary | ICD-10-CM | POA: Diagnosis not present

## 2017-09-08 DIAGNOSIS — D122 Benign neoplasm of ascending colon: Secondary | ICD-10-CM | POA: Diagnosis not present

## 2017-09-08 DIAGNOSIS — K641 Second degree hemorrhoids: Secondary | ICD-10-CM | POA: Insufficient documentation

## 2017-09-08 DIAGNOSIS — Z8249 Family history of ischemic heart disease and other diseases of the circulatory system: Secondary | ICD-10-CM | POA: Insufficient documentation

## 2017-09-08 DIAGNOSIS — Z1211 Encounter for screening for malignant neoplasm of colon: Secondary | ICD-10-CM | POA: Diagnosis not present

## 2017-09-08 DIAGNOSIS — Z87891 Personal history of nicotine dependence: Secondary | ICD-10-CM | POA: Insufficient documentation

## 2017-09-08 DIAGNOSIS — Z85828 Personal history of other malignant neoplasm of skin: Secondary | ICD-10-CM | POA: Diagnosis not present

## 2017-09-08 DIAGNOSIS — Z800 Family history of malignant neoplasm of digestive organs: Secondary | ICD-10-CM | POA: Diagnosis not present

## 2017-09-08 DIAGNOSIS — D123 Benign neoplasm of transverse colon: Secondary | ICD-10-CM | POA: Diagnosis not present

## 2017-09-08 DIAGNOSIS — Z803 Family history of malignant neoplasm of breast: Secondary | ICD-10-CM | POA: Diagnosis not present

## 2017-09-08 DIAGNOSIS — Z90710 Acquired absence of both cervix and uterus: Secondary | ICD-10-CM | POA: Diagnosis not present

## 2017-09-08 HISTORY — PX: POLYPECTOMY: SHX5525

## 2017-09-08 HISTORY — PX: COLONOSCOPY WITH PROPOFOL: SHX5780

## 2017-09-08 SURGERY — COLONOSCOPY WITH PROPOFOL
Anesthesia: Choice | Site: Rectum | Wound class: Contaminated

## 2017-09-08 MED ORDER — PROPOFOL 10 MG/ML IV BOLUS
INTRAVENOUS | Status: DC | PRN
  Administered 2017-09-08: 50 mg via INTRAVENOUS
  Administered 2017-09-08 (×3): 20 mg via INTRAVENOUS
  Administered 2017-09-08: 30 mg via INTRAVENOUS
  Administered 2017-09-08: 10 mg via INTRAVENOUS
  Administered 2017-09-08: 20 mg via INTRAVENOUS
  Administered 2017-09-08: 100 mg via INTRAVENOUS
  Administered 2017-09-08: 20 mg via INTRAVENOUS

## 2017-09-08 MED ORDER — LIDOCAINE HCL (CARDIAC) 20 MG/ML IV SOLN
INTRAVENOUS | Status: DC | PRN
  Administered 2017-09-08: 50 mg via INTRAVENOUS

## 2017-09-08 MED ORDER — LACTATED RINGERS IV SOLN
10.0000 mL/h | INTRAVENOUS | Status: DC
  Administered 2017-09-08: 10 mL/h via INTRAVENOUS

## 2017-09-08 MED ORDER — STERILE WATER FOR IRRIGATION IR SOLN
Status: DC | PRN
  Administered 2017-09-08: .5 mL

## 2017-09-08 SURGICAL SUPPLY — 6 items
CANISTER SUCT 1200ML W/VALVE (MISCELLANEOUS) ×4 IMPLANT
FORCEPS BIOP RAD 4 LRG CAP 4 (CUTTING FORCEPS) ×4 IMPLANT
GOWN CVR UNV OPN BCK APRN NK (MISCELLANEOUS) ×4 IMPLANT
GOWN ISOL THUMB LOOP REG UNIV (MISCELLANEOUS) ×4
KIT ENDO PROCEDURE OLY (KITS) ×4 IMPLANT
WATER STERILE IRR 250ML POUR (IV SOLUTION) ×4 IMPLANT

## 2017-09-08 NOTE — Anesthesia Preprocedure Evaluation (Signed)
Anesthesia Evaluation  Patient identified by MRN, date of birth, ID band Patient awake    Reviewed: Allergy & Precautions, H&P , NPO status , Patient's Chart, lab work & pertinent test results  Airway Mallampati: II  TM Distance: >3 FB Neck ROM: full    Dental no notable dental hx.    Pulmonary former smoker,    Pulmonary exam normal breath sounds clear to auscultation       Cardiovascular Normal cardiovascular exam Rhythm:regular Rate:Normal     Neuro/Psych    GI/Hepatic   Endo/Other    Renal/GU      Musculoskeletal   Abdominal   Peds  Hematology   Anesthesia Other Findings   Reproductive/Obstetrics                             Anesthesia Physical Anesthesia Plan  ASA: II  Anesthesia Plan:    Post-op Pain Management:    Induction: Intravenous  PONV Risk Score and Plan: 2 and Propofol infusion and Treatment may vary due to age or medical condition  Airway Management Planned: Natural Airway  Additional Equipment:   Intra-op Plan:   Post-operative Plan:   Informed Consent: I have reviewed the patients History and Physical, chart, labs and discussed the procedure including the risks, benefits and alternatives for the proposed anesthesia with the patient or authorized representative who has indicated his/her understanding and acceptance.     Plan Discussed with: CRNA  Anesthesia Plan Comments:         Anesthesia Quick Evaluation

## 2017-09-08 NOTE — Transfer of Care (Signed)
Immediate Anesthesia Transfer of Care Note  Patient: Penny Roberts  Procedure(s) Performed: COLONOSCOPY WITH PROPOFOL (N/A Rectum) POLYPECTOMY (Rectum)  Patient Location: PACU  Anesthesia Type: No value filed.  Level of Consciousness: awake, alert  and patient cooperative  Airway and Oxygen Therapy: Patient Spontanous Breathing and Patient connected to supplemental oxygen  Post-op Assessment: Post-op Vital signs reviewed, Patient's Cardiovascular Status Stable, Respiratory Function Stable, Patent Airway and No signs of Nausea or vomiting  Post-op Vital Signs: Reviewed and stable  Complications: No apparent anesthesia complications

## 2017-09-08 NOTE — Anesthesia Procedure Notes (Signed)
Procedure Name: MAC Date/Time: 09/08/2017 8:45 AM Performed by: Janna Arch, CRNA Pre-anesthesia Checklist: Patient identified, Emergency Drugs available, Patient being monitored and Suction available Patient Re-evaluated:Patient Re-evaluated prior to induction Oxygen Delivery Method: Nasal cannula

## 2017-09-08 NOTE — H&P (Signed)
Penny Lame, MD [address] [place], [place] [address] Phone:[phone] Fax : [phone]  Primary Care Physician:  Penny Mc, MD Primary Gastroenterologist:  Dr. Allen Roberts  Pre-Procedure History & Physical: HPI:  Penny Roberts is a 57 y.o. female is here for an colonoscopy.   Past Medical History:  Diagnosis Date  . Allergy   . Cancer ([place])    skin  . Chicken pox     Past Surgical History:  Procedure Laterality Date  . ABDOMINAL HYSTERECTOMY  2001   due to emdometriosis  . 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 SALPINGOOPHORECTOMY    . TUBAL LIGATION      Prior to Admission medications   Medication Sig Start Date End Date Taking? Authorizing Provider  amoxicillin-clavulanate (AUGMENTIN) 875-125 MG tablet Take 1 tablet by mouth 2 (two) times daily. 08/28/17  Yes Penny Mc, MD  calcium carbonate (OS-CAL) 600 MG TABS Take 600 mg by mouth daily.   Yes [provider]  fish oil-omega-3 fatty acids 1000 MG capsule Take 1 g by mouth daily.    Yes [provider]  MELATONIN PO Take 1 tablet by mouth daily.   Yes [provider]  Multiple Vitamin (MULTIVITAMIN) capsule Take 1 capsule by mouth daily.   Yes [provider]  predniSONE (DELTASONE) 10 MG tablet 6 tablets on Day 1 , then reduce by 1 tablet daily until gone 08/28/17  Yes Penny Mc, MD  benzonatate (TESSALON) 200 MG capsule Take 1 capsule (200 mg total) by mouth 2 (two) times daily as needed for cough. Patient not taking: Reported on 08/31/2017 08/28/17   Penny Mc, MD  guaiFENesin-codeine [facility]) 100-10 MG/5ML syrup Take 5 mLs by mouth at bedtime as needed and may repeat dose one time if needed for cough. Patient not taking: Reported on 08/31/2017 08/28/17   Penny Mc, MD    Allergies as of 08/14/2017  . (No Known Allergies)    Family History  Problem Relation Age of Onset  . Heart disease  Mother        A-Fib  . Breast cancer Mother 46  . Cancer Father        colon cancer  . Alcohol abuse Father   . Hypertension Father   . Cancer Maternal Aunt        breast cancer  . Breast cancer Maternal Aunt     Social History   Socioeconomic History  . Marital status: Married    Spouse name: Not on file  . Number of children: Not on file  . Years of education: Not on file  . Highest education level: Not on file  Social Needs  . Financial resource strain: Not on file  . Food insecurity - worry: Not on file  . Food insecurity - inability: Not on file  . Transportation needs - medical: Not on file  . Transportation needs - non-medical: Not on file  Occupational History  . Not on file  Tobacco Use  . Smoking status: Former Smoker    Last attempt to quit: 05/09/2004    Years since quitting: 13.3  . Smokeless tobacco: Never Used  Substance and Sexual Activity  . Alcohol use: Yes    Alcohol/week: 2.4 oz    Types: 4 Glasses of wine per week    Comment: occassional alcohol use  . Drug use: No  . Sexual activity: Yes  Birth control/protection: Surgical  Other Topics Concern  . Not on file  Social History Narrative  . Not on file    Review of Systems: See HPI, otherwise negative ROS  Physical Exam: BP 95/71   Pulse 77   Temp (!) 97.3 F (36.3 C) (Temporal)   Ht 5\' 7"  (1.702 m)   Wt 155 lb (70.3 kg)   SpO2 98%   BMI 24.28 kg/m  General:   Alert,  pleasant and cooperative in NAD Head:  Normocephalic and atraumatic. Neck:  Supple; no masses or thyromegaly. Lungs:  Clear throughout to auscultation.    Heart:  Regular rate and rhythm. Abdomen:  Soft, nontender and nondistended. Normal bowel sounds, without guarding, and without rebound.   Neurologic:  Alert and  oriented x4;  grossly normal neurologically.  Impression/Plan: Penny Roberts is here for an colonoscopy to be performed for family history of colon cancer  Risks, benefits, limitations, and  alternatives regarding  colonoscopy have been reviewed with the patient.  Questions have been answered.  All parties agreeable.   Penny Lame, MD  09/08/2017, 8:09 AM

## 2017-09-08 NOTE — Op Note (Signed)
[facility] Gastroenterology Patient Name: Penny Roberts Procedure Date: 09/08/2017 8:41 AM MRN: [id number] Account #: [id number] Date of Birth: [birth year] Admit Type: Outpatient Age: 57 Room: [facility] OR ROOM 01 Gender: Female Note Status: Finalized Procedure:            Colonoscopy Indications:          Family history of anal canal cancer in a first-degree                        relative Providers:            Lucilla Lame MD, MD Referring MD:         Deborra Medina, MD (Referring MD) Medicines:            Propofol per Anesthesia Complications:        No immediate complications. Procedure:            Pre-Anesthesia Assessment:                       - Prior to the procedure, a History and Physical was                        performed, and patient medications and allergies were                        reviewed. The patient's tolerance of previous                        anesthesia was also reviewed. The risks and benefits of                        the procedure and the sedation options and risks were                        discussed with the patient. All questions were                        answered, and informed consent was obtained. Prior                        Anticoagulants: The patient has taken no previous                        anticoagulant or antiplatelet agents. ASA Grade                        Assessment: II - A patient with mild systemic disease.                        After reviewing the risks and benefits, the patient was                        deemed in satisfactory condition to undergo the                        procedure.                       After obtaining informed consent, the colonoscope was  passed under direct vision. Throughout the procedure,                        the patient's blood pressure, pulse, and oxygen                        saturations were monitored continuously. The [place]  [id number]) was introduced through the                        anus and advanced to the the cecum, identified by                        appendiceal orifice and ileocecal valve. The                        colonoscopy was performed without difficulty. The                        patient tolerated the procedure well. The quality of                        the bowel preparation was excellent. Findings:      The perianal and digital rectal examinations were normal.      Two sessile polyps were found in the ascending colon. The polyps were 2       to 3 mm in size. These polyps were removed with a cold biopsy forceps.       Resection and retrieval were complete.      A single diverticulum was found in the sigmoid colon.      Non-bleeding internal hemorrhoids were found during retroflexion. The       hemorrhoids were Grade II (internal hemorrhoids that prolapse but reduce       spontaneously). Impression:           - Two 2 to 3 mm polyps in the ascending colon, removed                        with a cold biopsy forceps. Resected and retrieved.                       - Diverticulosis in the sigmoid colon.                       - Non-bleeding internal hemorrhoids. Recommendation:       - Discharge patient to home.                       - Resume previous diet.                       - Continue present medications.                       - Await pathology results.                       - Repeat colonoscopy in 5 years for retreatment. Procedure Code(s):    --- Professional ---  45380, Colonoscopy, flexible; with biopsy, single or                        multiple Diagnosis Code(s):    --- Professional ---                       Z80.0, Family history of malignant neoplasm of                        digestive organs                       D12.2, Benign neoplasm of ascending colon CPT copyright 2016 American Medical Association. All rights reserved. The codes documented in this report are  preliminary and upon coder review may  be revised to meet current compliance requirements. Lucilla Lame MD, MD 09/08/2017 9:08:52 AM This report has been signed electronically. Number of Addenda: 0 Note Initiated On: 09/08/2017 8:41 AM Scope Withdrawal Time: 0 hours 8 minutes 35 seconds  Total Procedure Duration: 0 hours 13 minutes 7 seconds       [facility]

## 2017-09-08 NOTE — Anesthesia Postprocedure Evaluation (Signed)
Anesthesia Post Note  Patient: Penny Roberts  Procedure(s) Performed: COLONOSCOPY WITH PROPOFOL (N/A Rectum) POLYPECTOMY (Rectum)  Patient location during evaluation: PACU Level of consciousness: awake and alert and oriented Pain management: satisfactory to patient Vital Signs Assessment: post-procedure vital signs reviewed and stable Respiratory status: spontaneous breathing, nonlabored ventilation and respiratory function stable Cardiovascular status: blood pressure returned to baseline and stable Postop Assessment: Adequate PO intake and No signs of nausea or vomiting Anesthetic complications: no    Raliegh Ip

## 2017-09-11 ENCOUNTER — Encounter: Payer: Self-pay | Admitting: Gastroenterology

## 2017-09-12 ENCOUNTER — Encounter: Payer: Self-pay | Admitting: Gastroenterology

## 2017-09-13 ENCOUNTER — Encounter: Payer: Self-pay | Admitting: Gastroenterology

## 2018-01-06 ENCOUNTER — Encounter: Payer: Self-pay | Admitting: Internal Medicine

## 2018-01-15 NOTE — Telephone Encounter (Signed)
I spoke with the patient she only wanted to make Dr. Derrel Nip aware of what happened regarding the bill from the anesthesia. She will not return to that location to have her next colonoscopy due to billing error.

## 2018-06-21 ENCOUNTER — Ambulatory Visit (INDEPENDENT_AMBULATORY_CARE_PROVIDER_SITE_OTHER): Payer: BC Managed Care – PPO | Admitting: Internal Medicine

## 2018-06-21 ENCOUNTER — Encounter: Payer: Self-pay | Admitting: Internal Medicine

## 2018-06-21 VITALS — BP 98/70 | HR 91 | Temp 97.7°F | Resp 14 | Ht 67.0 in | Wt 173.0 lb

## 2018-06-21 DIAGNOSIS — Z23 Encounter for immunization: Secondary | ICD-10-CM | POA: Diagnosis not present

## 2018-06-21 DIAGNOSIS — N952 Postmenopausal atrophic vaginitis: Secondary | ICD-10-CM

## 2018-06-21 DIAGNOSIS — Z1239 Encounter for other screening for malignant neoplasm of breast: Secondary | ICD-10-CM

## 2018-06-21 DIAGNOSIS — D123 Benign neoplasm of transverse colon: Secondary | ICD-10-CM

## 2018-06-21 DIAGNOSIS — E782 Mixed hyperlipidemia: Secondary | ICD-10-CM | POA: Diagnosis not present

## 2018-06-21 DIAGNOSIS — Z0000 Encounter for general adult medical examination without abnormal findings: Secondary | ICD-10-CM

## 2018-06-21 DIAGNOSIS — R5383 Other fatigue: Secondary | ICD-10-CM

## 2018-06-21 LAB — COMPREHENSIVE METABOLIC PANEL
ALBUMIN: 4.1 g/dL (ref 3.5–5.2)
ALK PHOS: 52 U/L (ref 39–117)
ALT: 30 U/L (ref 0–35)
AST: 24 U/L (ref 0–37)
BILIRUBIN TOTAL: 0.6 mg/dL (ref 0.2–1.2)
BUN: 16 mg/dL (ref 6–23)
CALCIUM: 8.8 mg/dL (ref 8.4–10.5)
CO2: 30 meq/L (ref 19–32)
Chloride: 104 mEq/L (ref 96–112)
Creatinine, Ser: 0.69 mg/dL (ref 0.40–1.20)
GFR: 93.34 mL/min (ref >60.00)
Glucose, Bld: 83 mg/dL (ref 70–99)
Potassium: 4 mEq/L (ref 3.5–5.1)
Sodium: 141 mEq/L (ref 135–145)
TOTAL PROTEIN: 6.7 g/dL (ref 6.0–8.3)

## 2018-06-21 LAB — CBC WITH DIFFERENTIAL/PLATELET
Basophils Absolute: 0.1 10*3/uL (ref 0.0–0.1)
Basophils Relative: 1.1 % (ref 0.0–3.0)
Eosinophils Absolute: 0.2 10*3/uL (ref 0.0–0.7)
Eosinophils Relative: 4 % (ref 0.0–5.0)
HCT: 42.3 % (ref 36.0–46.0)
Hemoglobin: 14 g/dL (ref 12.0–15.0)
Lymphocytes Relative: 31 % (ref 12.0–46.0)
Lymphs Abs: 1.6 10*3/uL (ref 0.7–4.0)
MCHC: 33 g/dL (ref 30.0–36.0)
MCV: 92.1 fl (ref 78.0–100.0)
Monocytes Absolute: 0.5 10*3/uL (ref 0.1–1.0)
Monocytes Relative: 9.4 % (ref 3.0–12.0)
Neutro Abs: 2.8 10*3/uL (ref 1.4–7.7)
Neutrophils Relative %: 54.5 % (ref 43.0–77.0)
Platelets: 213 10*3/uL (ref 150.0–400.0)
RBC: 4.59 Mil/uL (ref 3.87–5.11)
RDW: 14.2 % (ref 11.5–15.5)
WBC: 5.1 10*3/uL (ref 4.0–10.5)

## 2018-06-21 LAB — LIPID PANEL
Cholesterol: 243 mg/dL — ABNORMAL HIGH (ref 0–200)
HDL: 98.9 mg/dL (ref >39.00)
LDL Cholesterol: 128 mg/dL — ABNORMAL HIGH (ref 0–99)
NonHDL: 144.18
Total CHOL/HDL Ratio: 2
Triglycerides: 82 mg/dL (ref 0.0–149.0)
VLDL: 16.4 mg/dL (ref 0.0–40.0)

## 2018-06-21 LAB — TSH: TSH: 0.98 u[IU]/mL (ref 0.35–4.50)

## 2018-06-21 MED ORDER — TRAZODONE HCL 50 MG PO TABS: 25.0000 mg | ORAL_TABLET | Freq: Every evening | ORAL | 3 refills | Status: DC | PRN

## 2018-06-21 NOTE — Progress Notes (Signed)
Patient ID: Penny Roberts, female    DOB: [birth year]  Age: 57 y.o. MRN: [id number]  The patient is here for annual preventive examination and management of other chronic and acute problems.  Last seen Feb 2019    The risk factors are reflected in the social history.  The roster of all physicians providing medical care to patient - is listed in the Snapshot section of the chart.  Activities of daily living:  The patient is 100% independent in all ADLs: dressing, toileting, feeding as well as independent mobility  Home safety : The patient has smoke detectors in the home. They wear seatbelts.  There are no firearms at home. There is no violence in the home.   There is no risks for hepatitis, STDs or HIV. There is no   history of blood transfusion. They have no travel history to infectious disease endemic areas of the world.  The patient has seen their dentist in the last six month. They have seen their eye doctor in the last year. They deny hearing difficulty with regard to whispered voices and some television programs.   They do not  have excessive sun exposure. Discussed the need for sun protection: hats, long sleeves and use of sunscreen if there is significant sun exposure.   Diet: the importance of a healthy diet is discussed. They do have a healthy diet.  The benefits of regular aerobic exercise were discussed. She exercises 4 times per week ,  60 minutes.   Depression screen: there are no signs or vegative symptoms of depression- irritability, change in appetite, anhedonia, sadness/tearfullness.  Cognitive assessment: the patient manages all their financial and personal affairs and is actively engaged. They could relate day,date,year and events; recalled 2/3 objects at 3 minutes; performed clock-face test normally.  The following portions of the patient's history were reviewed and updated as appropriate: allergies, current medications, past family history, past medical history,   past surgical history, past social history  and problem list.  Visual acuity was not assessed per patient preference since she has regular follow up with her ophthalmologist. Hearing and body mass index were assessed and reviewed.   During the course of the visit the patient was educated and counseled about appropriate screening and preventive services including : fall prevention , diabetes screening, nutrition counseling, colorectal cancer screening, and recommended immunizations.    CC: The primary encounter diagnosis was Fatigue, unspecified type. Diagnoses of Moderate mixed hyperlipidemia not requiring statin therapy, Breast cancer screening, Need for immunization against influenza, Encounter for preventive health examination, Benign neoplasm of transverse colon, and Atrophic vaginitis were also pertinent to this visit.  URI symptoms  History Penny Roberts has a past medical history of Allergy, Cancer ([place]), and Chicken pox.   She has a past surgical history that includes Tubal ligation; Removed benign tumor from chest -2011 (2011); Salpingoophorectomy; Abdominal hysterectomy (2001); Colonoscopy with propofol (N/A, 09/08/2017); and polypectomy (09/08/2017).   Her family history includes Alcohol abuse in her father; Breast cancer in her maternal aunt; Breast cancer (age of onset: 81) in her mother; Cancer in her father and maternal aunt; Heart disease in her mother; Hypertension in her father.She reports that she quit smoking about 14 years ago. She has never used smokeless tobacco. She reports that she drinks about 4.0 standard drinks of alcohol per week. She reports that she does not use drugs.  Outpatient Medications Prior to Visit  Medication Sig Dispense Refill  . MELATONIN PO Take 1 tablet by mouth daily.    Marland Kitchen  Multiple Vitamin (MULTIVITAMIN) capsule Take 1 capsule by mouth daily.    Marland Kitchen amoxicillin-clavulanate (AUGMENTIN) 875-125 MG tablet Take 1 tablet by mouth 2 (two) times daily. (Patient not  taking: Reported on 06/21/2018) 14 tablet 0  . benzonatate (TESSALON) 200 MG capsule Take 1 capsule (200 mg total) by mouth 2 (two) times daily as needed for cough. (Patient not taking: Reported on 08/31/2017) 20 capsule 0  . calcium carbonate (OS-CAL) 600 MG TABS Take 600 mg by mouth daily.    . fish oil-omega-3 fatty acids 1000 MG capsule Take 1 g by mouth daily.     Marland Kitchen guaiFENesin-codeine (CHERATUSSIN AC) 100-10 MG/5ML syrup Take 5 mLs by mouth at bedtime as needed and may repeat dose one time if needed for cough. (Patient not taking: Reported on 08/31/2017) 118 mL 0  . predniSONE (DELTASONE) 10 MG tablet 6 tablets on Day 1 , then reduce by 1 tablet daily until gone (Patient not taking: Reported on 06/21/2018) 21 tablet 0   No facility-administered medications prior to visit.     Review of Systems   Patient denies headache, fevers, malaise, unintentional weight loss, skin rash, eye pain,  sinus pain, sore throat, dysphagia,  hemoptysis , dyspnea, wheezing, chest pain, palpitations, orthopnea, edema, abdominal pain, nausea, melena, diarrhea, constipation, flank pain, dysuria, hematuria, urinary  Frequency, nocturia, numbness, tingling, seizures,  Focal weakness, Loss of consciousness,  Tremor, insomnia, depression, anxiety, and suicidal ideation.      Objective:  BP 98/70 (BP Location: Left Arm, Patient Position: Sitting, Cuff Size: Normal)   Pulse 91   Temp 97.7 F (36.5 C) (Oral)   Resp 14   Ht 5\' 7"  (1.702 m)   Wt 173 lb (78.5 kg)   SpO2 95%   BMI 27.10 kg/m   Physical Exam   General appearance: alert, cooperative and appears stated age Head: Normocephalic, without obvious abnormality, atraumatic Eyes: conjunctivae/corneas clear. PERRL, EOM's intact. Fundi benign. Ears: normal TM's and external ear canals both ears Nose: Nares normal. Septum midline. Mucosa normal. No drainage or sinus tenderness. Throat: lips, mucosa, and tongue normal; teeth and gums normal Neck: no adenopathy,  no carotid bruit, no JVD, supple, symmetrical, trachea midline and thyroid not enlarged, symmetric, no tenderness/mass/nodules Lungs: clear to auscultation bilaterally Breasts: normal appearance, no masses or tenderness Heart: regular rate and rhythm, S1, S2 normal, no murmur, click, rub or gallop Abdomen: soft, non-tender; bowel sounds normal; no masses,  no organomegaly Extremities: extremities normal, atraumatic, no cyanosis or edema Pulses: 2+ and symmetric Skin: Skin color, texture, turgor normal. No rashes or lesions Neurologic: Alert and oriented X 3, normal strength and tone. Normal symmetric reflexes. Normal coordination and gait.      Assessment & Plan:   Problem List Items Addressed This Visit    Atrophic vaginitis    Trial of premarin cream offered but declined.        Benign neoplasm of transverse colon    2 sessile polyps removed during screening colonoscopy in 2019.  5 yr follow up due to family history      Encounter for preventive health examination    Annual comprehensive preventive exam was done as well as an evaluation and management of chronic conditions .  During the course of the visit the patient was educated and counseled about appropriate screening and preventive services including :  diabetes screening, lipid analysis with projected  10 year  risk for CAD , nutrition counseling, breast, cervical and colorectal cancer screening, and recommended immunizations.  Printed recommendations for health maintenance screenings was given       Other Visit Diagnoses    Fatigue, unspecified type    -  Primary   Relevant Orders   Comprehensive metabolic panel (Completed)   TSH (Completed)   CBC with Differential/Platelet (Completed)   Moderate mixed hyperlipidemia not requiring statin therapy       Relevant Orders   Lipid panel (Completed)   Breast cancer screening       Relevant Orders   MM 3D SCREEN BREAST BILATERAL   Need for immunization against influenza        Relevant Orders   Flu Vaccine QUAD 36+ mos IM (Completed)      I have discontinued Kihanna B. Trawick's fish oil-omega-3 fatty acids, calcium carbonate, amoxicillin-clavulanate, predniSONE, guaiFENesin-codeine, and benzonatate. I am also having her start on traZODone. Additionally, I am having her maintain her multivitamin and MELATONIN PO.  Meds ordered this encounter  Medications  . traZODone (DESYREL) 50 MG tablet    Sig: Take 0.5-1 tablets (25-50 mg total) by mouth at bedtime as needed for sleep.    Dispense:  90 tablet    Refill:  3    Medications Discontinued During This Encounter  Medication Reason  . amoxicillin-clavulanate (AUGMENTIN) 875-125 MG tablet Completed Course  . benzonatate (TESSALON) 200 MG capsule Completed Course  . calcium carbonate (OS-CAL) 600 MG TABS Patient Preference  . fish oil-omega-3 fatty acids 1000 MG capsule Patient Preference  . guaiFENesin-codeine (CHERATUSSIN AC) 100-10 MG/5ML syrup Completed Course  . predniSONE (DELTASONE) 10 MG tablet Completed Course    Follow-up: No follow-ups on file.   Crecencio Mc, MD

## 2018-06-21 NOTE — Patient Instructions (Signed)
Your annual mammogram has been ordered.  You are  due in early February and encouraged (required) to call to make your appointment at [facility]   Health Maintenance for Postmenopausal Women Menopause is a normal process in which your reproductive ability comes to an end. This process happens gradually over a span of months to years, usually between the ages of 40 and 63. Menopause is complete when you have missed 12 consecutive menstrual periods. It is important to talk with your health care provider about some of the most common conditions that affect postmenopausal women, such as heart disease, cancer, and bone loss (osteoporosis). Adopting a healthy lifestyle and getting preventive care can help to promote your health and wellness. Those actions can also lower your chances of developing some of these common conditions. What should I know about menopause? During menopause, you may experience a number of symptoms, such as:  Moderate-to-severe hot flashes.  Night sweats.  Decrease in sex drive.  Mood swings.  Headaches.  Tiredness.  Irritability.  Memory problems.  Insomnia.  Choosing to treat or not to treat menopausal changes is an individual decision that you make with your health care provider. What should I know about hormone replacement therapy and supplements? Hormone therapy products are effective for treating symptoms that are associated with menopause, such as hot flashes and night sweats. Hormone replacement carries certain risks, especially as you become older. If you are thinking about using estrogen or estrogen with progestin treatments, discuss the benefits and risks with your health care provider. What should I know about heart disease and stroke? Heart disease, heart attack, and stroke become more likely as you age. This may be due, in part, to the hormonal changes that your body experiences during menopause. These can affect how your body processes  dietary fats, triglycerides, and cholesterol. Heart attack and stroke are both medical emergencies. There are many things that you can do to help prevent heart disease and stroke:  Have your blood pressure checked at least every 1-2 years. High blood pressure causes heart disease and increases the risk of stroke.  If you are 83-51 years old, ask your health care provider if you should take aspirin to prevent a heart attack or a stroke.  Do not use any tobacco products, including cigarettes, chewing tobacco, or electronic cigarettes. If you need help quitting, ask your health care provider.  It is important to eat a healthy diet and maintain a healthy weight. ? Be sure to include plenty of vegetables, fruits, low-fat dairy products, and lean protein. ? Avoid eating foods that are high in solid fats, added sugars, or salt (sodium).  Get regular exercise. This is one of the most important things that you can do for your health. ? Try to exercise for at least 150 minutes each week. The type of exercise that you do should increase your heart rate and make you sweat. This is known as moderate-intensity exercise. ? Try to do strengthening exercises at least twice each week. Do these in addition to the moderate-intensity exercise.  Know your numbers.Ask your health care provider to check your cholesterol and your blood glucose. Continue to have your blood tested as directed by your health care provider.  What should I know about cancer screening? There are several types of cancer. Take the following steps to reduce your risk and to catch any cancer development as early as possible. Breast Cancer  Practice breast self-awareness. ? This means understanding how your breasts  normally appear and feel. ? It also means doing regular breast self-exams. Let your health care provider know about any changes, no matter how small.  If you are 63 or older, have a clinician do a breast exam (clinical breast  exam or CBE) every year. Depending on your age, family history, and medical history, it may be recommended that you also have a yearly breast X-ray (mammogram).  If you have a family history of breast cancer, talk with your health care provider about genetic screening.  If you are at high risk for breast cancer, talk with your health care provider about having an MRI and a mammogram every year.  Breast cancer (BRCA) gene test is recommended for women who have family members with BRCA-related cancers. Results of the assessment will determine the need for genetic counseling and BRCA1 and for BRCA2 testing. BRCA-related cancers include these types: ? Breast. This occurs in males or females. ? Ovarian. ? Tubal. This may also be called fallopian tube cancer. ? Cancer of the abdominal or pelvic lining (peritoneal cancer). ? Prostate. ? Pancreatic.  Cervical, Uterine, and Ovarian Cancer Your health care provider may recommend that you be screened regularly for cancer of the pelvic organs. These include your ovaries, uterus, and vagina. This screening involves a pelvic exam, which includes checking for microscopic changes to the surface of your cervix (Pap test).  For women ages 21-65, health care providers may recommend a pelvic exam and a Pap test every three years. For women ages 56-65, they may recommend the Pap test and pelvic exam, combined with testing for human papilloma virus (HPV), every five years. Some types of HPV increase your risk of cervical cancer. Testing for HPV may also be done on women of any age who have unclear Pap test results.  Other health care providers may not recommend any screening for nonpregnant women who are considered low risk for pelvic cancer and have no symptoms. Ask your health care provider if a screening pelvic exam is right for you.  If you have had past treatment for cervical cancer or a condition that could lead to cancer, you need Pap tests and screening for  cancer for at least 20 years after your treatment. If Pap tests have been discontinued for you, your risk factors (such as having a new sexual partner) need to be reassessed to determine if you should start having screenings again. Some women have medical problems that increase the chance of getting cervical cancer. In these cases, your health care provider may recommend that you have screening and Pap tests more often.  If you have a family history of uterine cancer or ovarian cancer, talk with your health care provider about genetic screening.  If you have vaginal bleeding after reaching menopause, tell your health care provider.  There are currently no reliable tests available to screen for ovarian cancer.  Lung Cancer Lung cancer screening is recommended for adults 32-37 years old who are at high risk for lung cancer because of a history of smoking. A yearly low-dose CT scan of the lungs is recommended if you:  Currently smoke.  Have a history of at least 30 pack-years of smoking and you currently smoke or have quit within the past 15 years. A pack-year is smoking an average of one pack of cigarettes per day for one year.  Yearly screening should:  Continue until it has been 15 years since you quit.  Stop if you develop a health problem that would prevent  you from having lung cancer treatment.  Colorectal Cancer  This type of cancer can be detected and can often be prevented.  Routine colorectal cancer screening usually begins at age 89 and continues through age 22.  If you have risk factors for colon cancer, your health care provider may recommend that you be screened at an earlier age.  If you have a family history of colorectal cancer, talk with your health care provider about genetic screening.  Your health care provider may also recommend using home test kits to check for hidden blood in your stool.  A small camera at the end of a tube can be used to examine your colon  directly (sigmoidoscopy or colonoscopy). This is done to check for the earliest forms of colorectal cancer.  Direct examination of the colon should be repeated every 5-10 years until age 18. However, if early forms of precancerous polyps or small growths are found or if you have a family history or genetic risk for colorectal cancer, you may need to be screened more often.  Skin Cancer  Check your skin from head to toe regularly.  Monitor any moles. Be sure to tell your health care provider: ? About any new moles or changes in moles, especially if there is a change in a mole's shape or color. ? If you have a mole that is larger than the size of a pencil eraser.  If any of your family members has a history of skin cancer, especially at a young age, talk with your health care provider about genetic screening.  Always use sunscreen. Apply sunscreen liberally and repeatedly throughout the day.  Whenever you are outside, protect yourself by wearing long sleeves, pants, a wide-brimmed hat, and sunglasses.  What should I know about osteoporosis? Osteoporosis is a condition in which bone destruction happens more quickly than new bone creation. After menopause, you may be at an increased risk for osteoporosis. To help prevent osteoporosis or the bone fractures that can happen because of osteoporosis, the following is recommended:  If you are 5-79 years old, get at least 1,000 mg of calcium and at least 600 mg of vitamin D per day.  If you are older than age 15 but younger than age 58, get at least 1,200 mg of calcium and at least 600 mg of vitamin D per day.  If you are older than age 69, get at least 1,200 mg of calcium and at least 800 mg of vitamin D per day.  Smoking and excessive alcohol intake increase the risk of osteoporosis. Eat foods that are rich in calcium and vitamin D, and do weight-bearing exercises several times each week as directed by your health care provider. What should I  know about how menopause affects my mental health? Depression may occur at any age, but it is more common as you become older. Common symptoms of depression include:  Low or sad mood.  Changes in sleep patterns.  Changes in appetite or eating patterns.  Feeling an overall lack of motivation or enjoyment of activities that you previously enjoyed.  Frequent crying spells.  Talk with your health care provider if you think that you are experiencing depression. What should I know about immunizations? It is important that you get and maintain your immunizations. These include:  Tetanus, diphtheria, and pertussis (Tdap) booster vaccine.  Influenza every year before the flu season begins.  Pneumonia vaccine.  Shingles vaccine.  Your health care provider may also recommend other immunizations. This information  is not intended to replace advice given to you by your health care provider. Make sure you discuss any questions you have with your health care provider. Document Released: 08/26/2005 Document Revised: 01/22/2016 Document Reviewed: 04/07/2015 Elsevier Interactive Patient Education  2018 [employer].

## 2018-06-23 NOTE — Assessment & Plan Note (Signed)
Trial of premarin cream offered but declined.

## 2018-06-23 NOTE — Assessment & Plan Note (Signed)
Annual comprehensive preventive exam was done as well as an evaluation and management of chronic conditions .  During the course of the visit the patient was educated and counseled about appropriate screening and preventive services including :  diabetes screening, lipid analysis with projected  10 year  risk for CAD , nutrition counseling, breast, cervical and colorectal cancer screening, and recommended immunizations.  Printed recommendations for health maintenance screenings was given 

## 2018-06-23 NOTE — Assessment & Plan Note (Signed)
2 sessile polyps removed during screening colonoscopy in 2019.  5 yr follow up due to family history

## 2018-08-06 ENCOUNTER — Encounter: Payer: Self-pay | Admitting: Internal Medicine

## 2018-08-30 DIAGNOSIS — Z13820 Encounter for screening for osteoporosis: Secondary | ICD-10-CM

## 2018-10-24 ENCOUNTER — Other Ambulatory Visit: Payer: Self-pay

## 2018-10-24 ENCOUNTER — Ambulatory Visit
Admission: RE | Admit: 2018-10-24 | Discharge: 2018-10-24 | Disposition: A | Discharge status: Home / Self Care | Payer: BC Managed Care – PPO | Source: Ambulatory Visit | Attending: Internal Medicine | Admitting: Internal Medicine

## 2018-10-24 ENCOUNTER — Other Ambulatory Visit: Payer: BC Managed Care – PPO

## 2018-10-24 DIAGNOSIS — Z1239 Encounter for other screening for malignant neoplasm of breast: Secondary | ICD-10-CM | POA: Diagnosis present

## 2018-10-24 DIAGNOSIS — Z13820 Encounter for screening for osteoporosis: Secondary | ICD-10-CM | POA: Diagnosis present

## 2019-06-27 ENCOUNTER — Other Ambulatory Visit: Payer: Self-pay

## 2019-06-27 ENCOUNTER — Encounter: Payer: Self-pay | Admitting: Internal Medicine

## 2019-06-27 ENCOUNTER — Ambulatory Visit (INDEPENDENT_AMBULATORY_CARE_PROVIDER_SITE_OTHER): Payer: BC Managed Care – PPO | Admitting: Internal Medicine

## 2019-06-27 DIAGNOSIS — Z7189 Other specified counseling: Secondary | ICD-10-CM | POA: Diagnosis not present

## 2019-06-27 DIAGNOSIS — E663 Overweight: Secondary | ICD-10-CM | POA: Diagnosis not present

## 2019-06-27 DIAGNOSIS — H8110 Benign paroxysmal vertigo, unspecified ear: Secondary | ICD-10-CM

## 2019-06-27 DIAGNOSIS — Z0000 Encounter for general adult medical examination without abnormal findings: Secondary | ICD-10-CM | POA: Diagnosis not present

## 2019-06-27 NOTE — Assessment & Plan Note (Signed)

## 2019-06-27 NOTE — Assessment & Plan Note (Signed)
Likely secondary to sinus congestion and low blood pressure.  Reassurance given,  Advised to start saline rinses,  Add salt to diet.

## 2019-06-27 NOTE — Assessment & Plan Note (Signed)
Educated patient on the newly broadened list of signs and symptoms of COVID-19 infection and ways to avoid the viral infection including washing hands frequently with soap and water,  using hand sanitizer if unable to wash, avoiding touching face,  staying at home and limiting visitors,  and avoiding contact with people coming in and out of home.  The importance of continued social distancing was discussed today . Patient was screened for the development of any unsafe behaviors or habits that may have developed as a result of the social impact of the virus , including alcohol abuse,  Domestic violence, tobacco abuse and overeating.

## 2019-06-27 NOTE — Progress Notes (Signed)
Virtual Visit via [email or web address]  This visit type was conducted due to national recommendations for restrictions regarding the COVID-19 pandemic (e.g. social distancing).  This format is felt to be most appropriate for this patient at this time.  All issues noted in this document were discussed and addressed.  No physical exam was performed (except for noted visual exam findings with Video Visits).   I connected with@ on 06/27/19 at  9:00 AM EST by a video enabled telemedicine application  and verified that I am speaking with the correct person using two identifiers. Location patient: home Location provider: work or home office Persons participating in the virtual visit: patient, provider  I discussed the limitations, risks, security and privacy concerns of performing an evaluation and management service by telephone and the availability of in person appointments. I also discussed with the patient that there may be a patient responsible charge related to this service. The patient expressed understanding and agreed to proceed.  Reason for visit: annual CPE   HPI:  Patient ID: Penny Roberts, female    DOB: 1960/07/31  Age: 58 y.o. MRN: [id number]  The patient is here for annual Medicare wellness examination and management of other chronic and acute problems.   The risk factors are reflected in the social history.  The roster of all physicians providing medical care to patient - is listed in the Snapshot section of the chart.  Activities of daily living:  The patient is 100% independent in all ADLs: dressing, toileting, feeding as well as independent mobility  Home safety : The patient has smoke detectors in the home. They wear seatbelts.  There are no firearms at home. There is no violence in the home.   There is no risks for hepatitis, STDs or HIV. There is no   history of blood transfusion. They have no travel history to infectious disease endemic areas of the world.  The patient has seen  their dentist in the last six month. They have seen their eye doctor in the last year. They admit to slight hearing difficulty with regard to whispered voices and some television programs.  They have deferred audiologic testing in the last year.  They do not  have excessive sun exposure. Discussed the need for sun protection: hats, long sleeves and use of sunscreen if there is significant sun exposure.   Diet: the importance of a healthy diet is discussed. They do have a healthy diet.  The benefits of regular aerobic exercise were discussed. She has been unable to exercise since the gyms closed several months ago .  Depression screen: there are no signs or vegative symptoms of depression- irritability, change in appetite, anhedonia, sadness/tearfullness.  The following portions of the patient's history were reviewed and updated as appropriate: allergies, current medications, past family history, past medical history,  past surgical history, past social history  and problem list.  Visual acuity was not assessed per patient preference since she has regular follow up with her ophthalmologist. Hearing and body mass index were assessed and reviewed.   During the course of the visit the patient was educated and counseled about appropriate screening and preventive services including : fall prevention , diabetes screening, nutrition counseling, colorectal cancer screening, and recommended immunizations.    CC: Diagnoses of Encounter for preventive health examination, Educated about COVID-19 virus infection, Overweight (BMI 25.0-29.9), and Benign paroxysmal positional vertigo, unspecified laterality were pertinent to this visit.   Recurrent self limited episodes of vertigo: occurring with position change,  Self limiting  No headaches,  Nausea.    Weight gain : not exercising. Frustrated    History Penny Roberts has a past medical history of Allergy, Cancer ([place]), and Chicken pox.   She has a past surgical  history that includes Tubal ligation; Removed benign tumor from chest -2011 (2011); Salpingoophorectomy; Abdominal hysterectomy (2001); Colonoscopy with propofol (N/A, 09/08/2017); and polypectomy (09/08/2017).   Her family history includes Alcohol abuse in her father; Breast cancer in her maternal aunt and maternal aunt; Breast cancer (age of onset: 31) in her mother; Cancer in her father and maternal aunt; Heart disease in her mother; Hypertension in her father.She reports that she quit smoking about 15 years ago. She has never used smokeless tobacco. She reports current alcohol use of about 4.0 standard drinks of alcohol per week. She reports that she does not use drugs.  Outpatient Medications Prior to Visit  Medication Sig Dispense Refill  . MELATONIN PO Take 1 tablet by mouth daily.    . Multiple Vitamin (MULTIVITAMIN) capsule Take 1 capsule by mouth daily.    . traZODone (DESYREL) 50 MG tablet Take 0.5-1 tablets (25-50 mg total) by mouth at bedtime as needed for sleep. (Patient not taking: Reported on 06/27/2019) 90 tablet 3   No facility-administered medications prior to visit.    ROS: Patient denies headache, fevers, malaise, unintentional weight loss, skin rash, eye pain, sinus congestion and sinus pain, sore throat, dysphagia,  hemoptysis , cough, dyspnea, wheezing, chest pain, palpitations, orthopnea, edema, abdominal pain, nausea, melena, diarrhea, constipation, flank pain, dysuria, hematuria, urinary  Frequency, nocturia, numbness, tingling, seizures,  Focal weakness, Loss of consciousness,  Tremor, insomnia, depression, anxiety, and suicidal ideation.      Past Medical History:  Diagnosis Date  . Allergy   . Cancer ([place])    skin  . Chicken pox     Past Surgical History:  Procedure Laterality Date  . ABDOMINAL HYSTERECTOMY  2001   due to emdometriosis  . COLONOSCOPY WITH PROPOFOL N/A 09/08/2017   Procedure: COLONOSCOPY WITH PROPOFOL;  Surgeon: Lucilla Lame, MD;  Location:  [facility];  Service: Endoscopy;  Laterality: N/A;  . POLYPECTOMY  09/08/2017   Procedure: POLYPECTOMY;  Surgeon: Lucilla Lame, MD;  Location: [facility];  Service: Endoscopy;;  . 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 SALPINGOOPHORECTOMY    . TUBAL LIGATION      Family History  Problem Relation Age of Onset  . Heart disease Mother        A-Fib  . Breast cancer Mother 65  . Cancer Father        colon cancer  . Alcohol abuse Father   . Hypertension Father   . Cancer Maternal Aunt        breast cancer  . Breast cancer Maternal Aunt   . Breast cancer Maternal Aunt     SOCIAL HX:  reports that she quit smoking about 15 years ago. She has never used smokeless tobacco. She reports current alcohol use of about 4.0 standard drinks of alcohol per week. She reports that she does not use drugs.   Current Outpatient Medications:  Marland Kitchen  MELATONIN PO, Take 1 tablet by mouth daily., Disp: , Rfl:  .  Multiple Vitamin (MULTIVITAMIN) capsule, Take 1 capsule by mouth daily., Disp: , Rfl:  .  traZODone (DESYREL) 50 MG tablet, Take 0.5-1 tablets (25-50 mg total) by mouth at bedtime as needed for sleep. (Patient not taking:  Reported on 06/27/2019), Disp: 90 tablet, Rfl: 3  EXAM:  VITALS per patient if applicable:  GENERAL: alert, oriented, appears well and in no acute distress  HEENT: atraumatic, conjunttiva clear, no obvious abnormalities on inspection of external nose and ears  NECK: normal movements of the head and neck  LUNGS: on inspection no signs of respiratory distress, breathing rate appears normal, no obvious gross SOB, gasping or wheezing  CV: no obvious cyanosis  MS: moves all visible extremities without noticeable abnormality  PSYCH/NEURO: pleasant and cooperative, no obvious depression or anxiety, speech and thought processing grossly intact  ASSESSMENT AND PLAN:  Discussed the following assessment and plan:  Encounter for  preventive health examination  Educated about COVID-19 virus infection  Overweight (BMI 25.0-29.9)  Benign paroxysmal positional vertigo, unspecified laterality  Encounter for preventive health examination age appropriate education and counseling updated, referrals for preventative services and immunizations addressed, dietary and smoking counseling addressed, most recent labs reviewed.  I have personally reviewed and have noted:  1) the patient's medical and social history 2) The pt's use of alcohol, tobacco, and illicit drugs 3) The patient's current medications and supplements 4) Functional ability including ADL's, fall risk, home safety risk, hearing and visual impairment 5) Diet and physical activities 6) Evidence for depression or mood disorder 7) The patient's height, weight, and BMI have been recorded in the chart  I have made referrals, and provided counseling and education based on review of the above  Educated about COVID-19 virus infection Educated patient on the newly broadened list of signs and symptoms of COVID-19 infection and ways to avoid the viral infection including washing hands frequently with soap and water,  using hand sanitizer if unable to wash, avoiding touching face,  staying at home and limiting visitors,  and avoiding contact with people coming in and out of home.  The importance of continued social distancing was discussed today . Patient was screened for the development of any unsafe behaviors or habits that may have developed as a result of the social impact of the virus , including alcohol abuse,  Domestic violence, tobacco abuse and overeating.     Overweight (BMI 25.0-29.9) Aggravated by lack of access to group exercise.  Encouraged  Her to be creative with exercise options and to  make exercise a priority   Benign positional vertigo Likely secondary to sinus congestion and low blood pressure.  Reassurance given,  Advised to start saline rinses,  Add  salt to diet.     I discussed the assessment and treatment plan with the patient. The patient was provided an opportunity to ask questions and all were answered. The patient agreed with the plan and demonstrated an understanding of the instructions.   The patient was advised to call back or seek an in-person evaluation if the symptoms worsen or if the condition fails to improve as anticipated.  I provided 30 minutes of non-face-to-face time during this encounter.   Crecencio Mc, MD

## 2019-06-27 NOTE — Assessment & Plan Note (Addendum)
Aggravated by lack of access to group exercise.  Encouraged  Her to be creative with exercise options and to  make exercise a priority

## 2019-12-04 IMAGING — MG DIGITAL SCREENING BILATERAL MAMMOGRAM WITH TOMO AND CAD
8 series · 8 of 24 positions shown · non-contrast
Comparison: Previous exam(s).

CLINICAL DATA: Screening.

EXAM:
DIGITAL SCREENING BILATERAL MAMMOGRAM WITH TOMO AND CAD

[L MLO synth-2D]
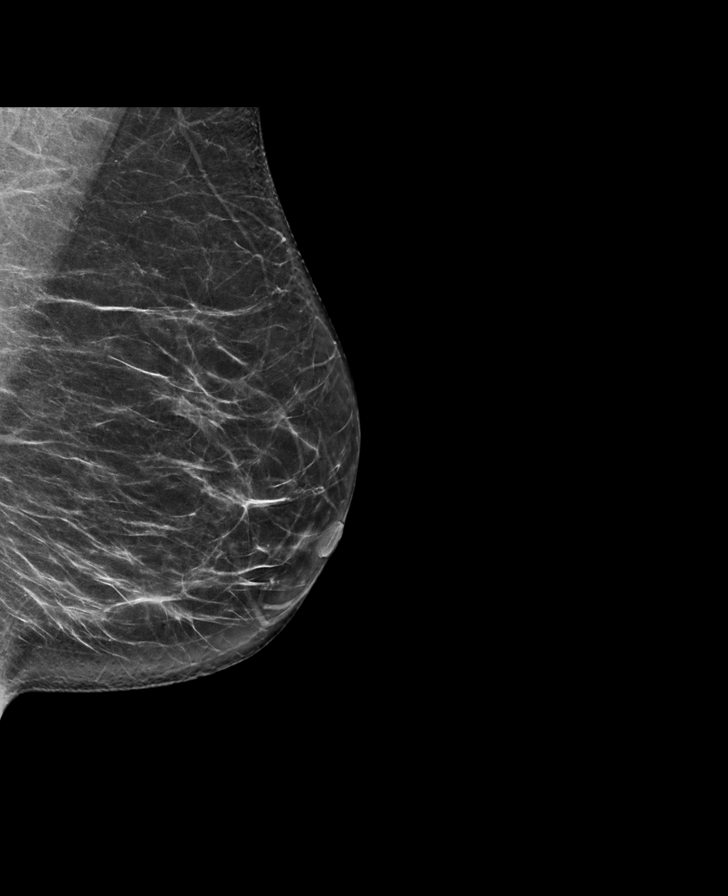

[L CC synth-2D]
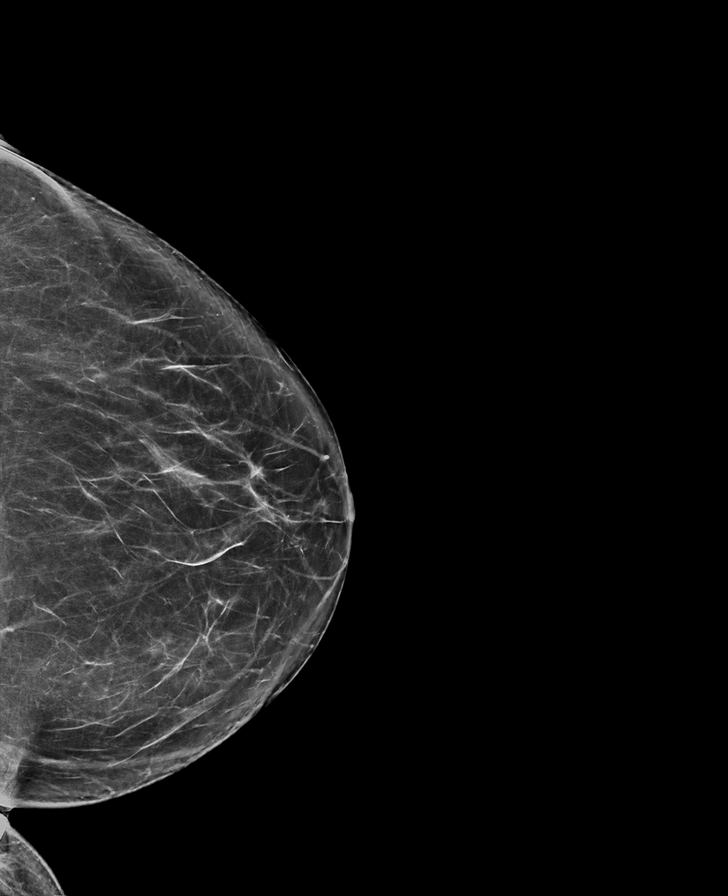

[R CC synth-2D]
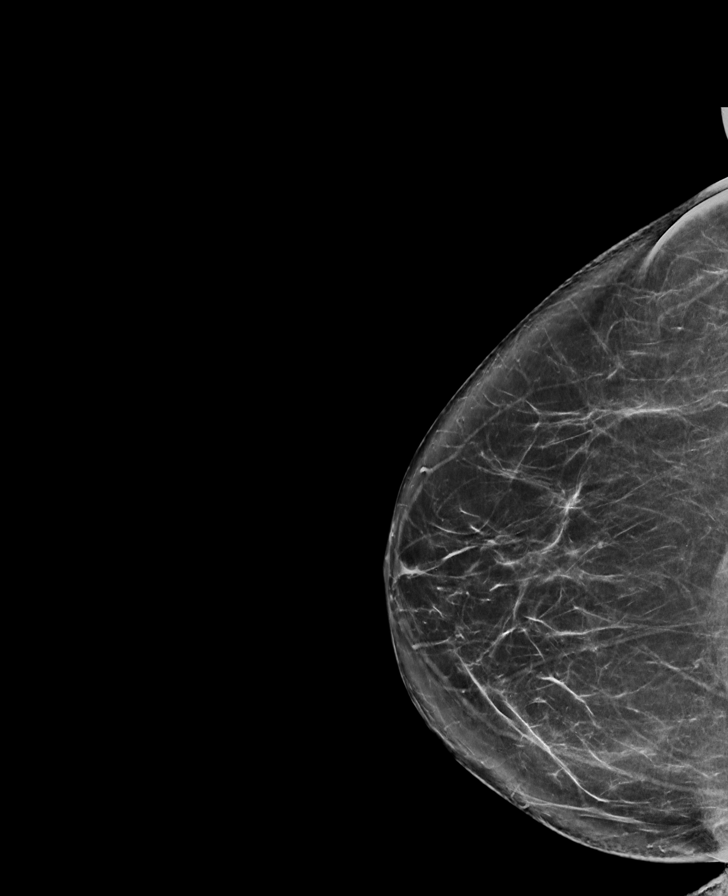

[R MLO synth-2D]
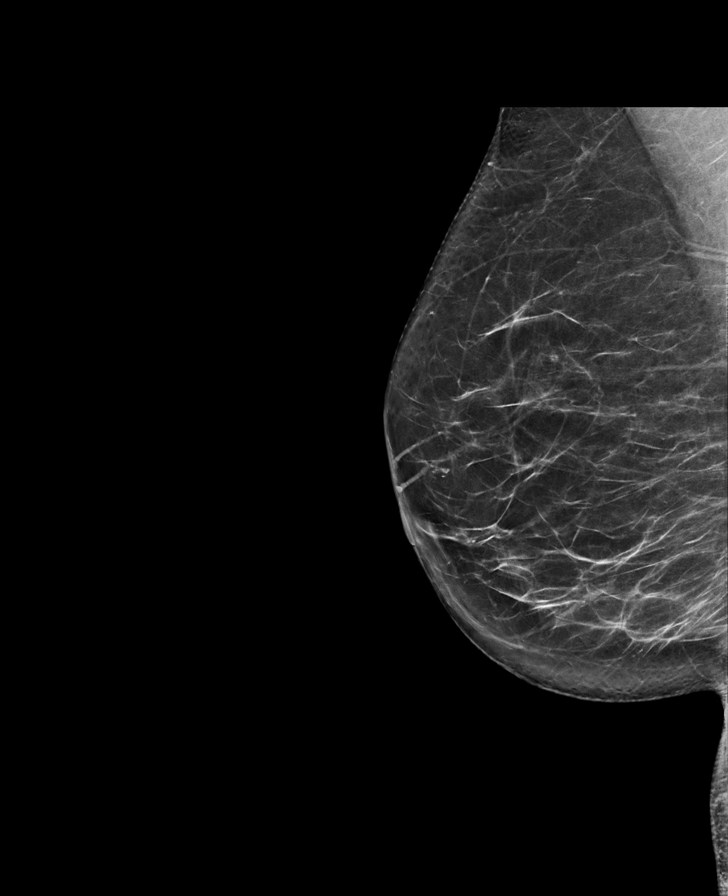

[R MLO tomo · tomo slice 39/76.0]
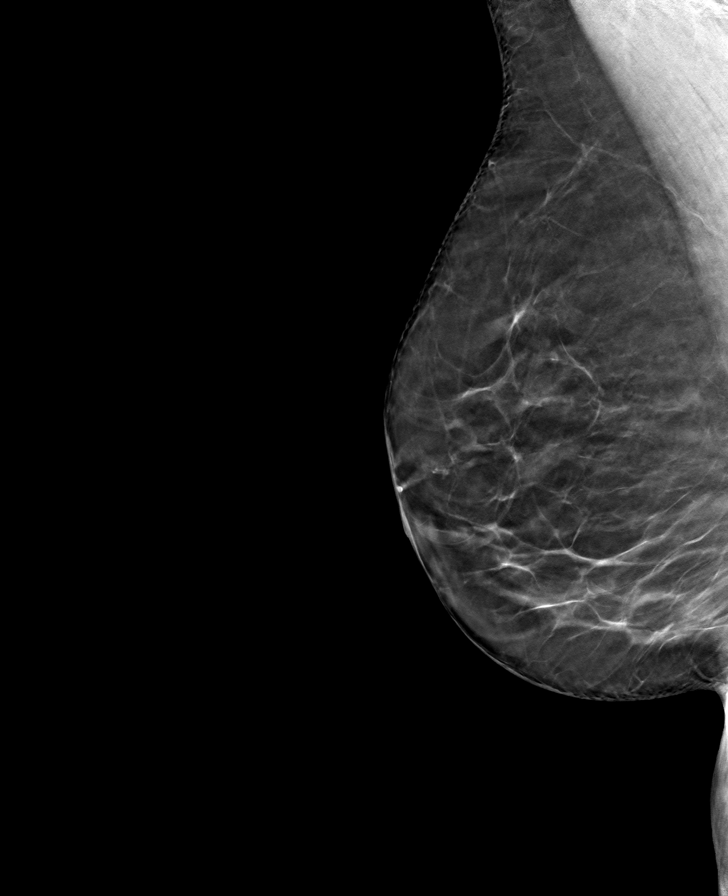

[R CC tomo · tomo slice 40/79.0]
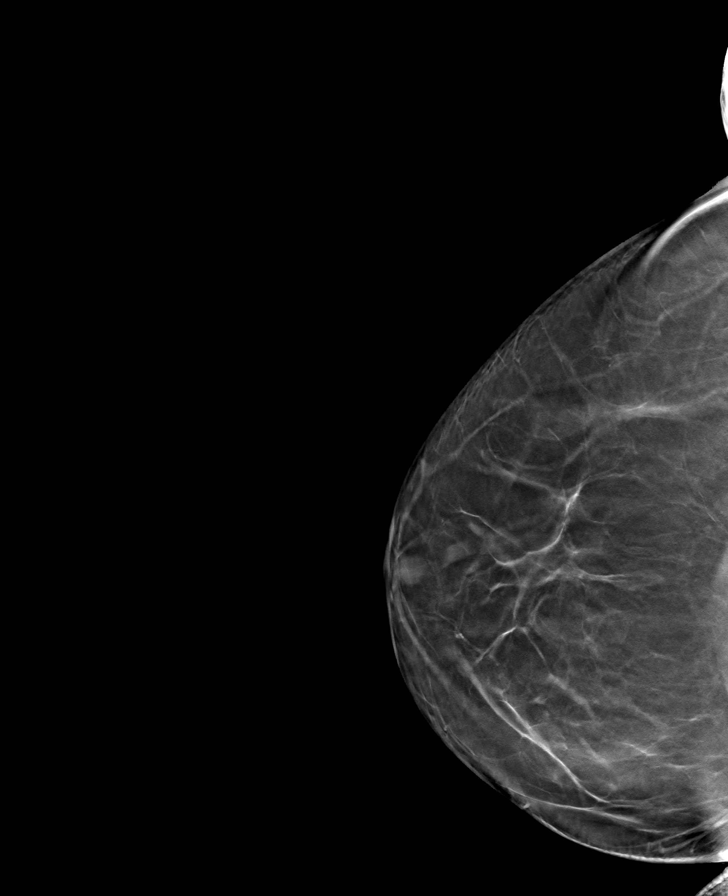

[L CC tomo · tomo slice 38/75.0]
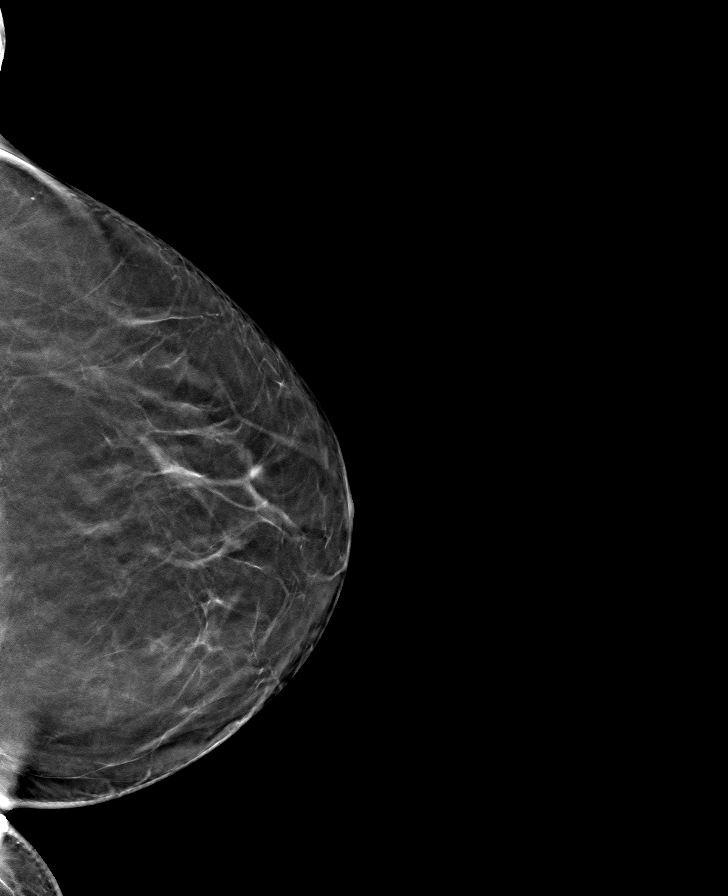

[L MLO tomo · tomo slice 38/75.0]
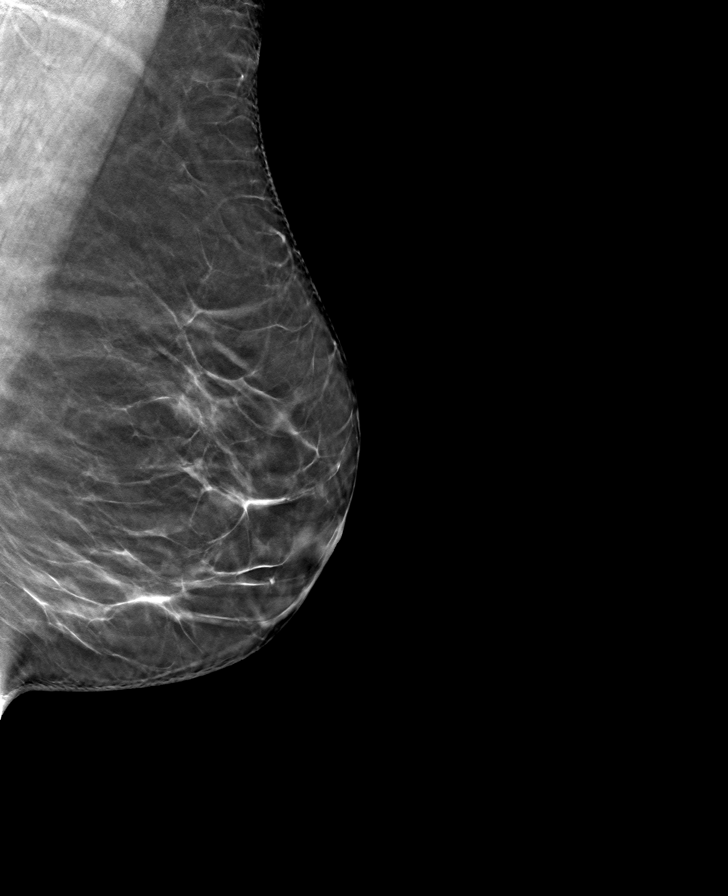

[8 of 24 positions shown; findings below may reference images not displayed]

ACR Breast Density Category b: There are scattered areas of
fibroglandular density.
FINDINGS: There are no findings suspicious for malignancy. Images were
processed with CAD.
IMPRESSION: No mammographic evidence of malignancy. A result letter of this
screening mammogram will be mailed directly to the patient.

RECOMMENDATION:
Screening mammogram in one year. (Code:[id number])

BI-RADS CATEGORY  1: Negative.

## 2020-02-07 ENCOUNTER — Other Ambulatory Visit: Payer: Self-pay | Admitting: Internal Medicine

## 2020-02-07 DIAGNOSIS — Z1231 Encounter for screening mammogram for malignant neoplasm of breast: Secondary | ICD-10-CM

## 2020-03-16 ENCOUNTER — Other Ambulatory Visit: Payer: Self-pay

## 2020-03-16 ENCOUNTER — Ambulatory Visit
Admission: RE | Admit: 2020-03-16 | Discharge: 2020-03-16 | Disposition: A | Discharge status: Home / Self Care | Payer: BC Managed Care – PPO | Source: Ambulatory Visit | Attending: Internal Medicine | Admitting: Internal Medicine

## 2020-03-16 DIAGNOSIS — Z1231 Encounter for screening mammogram for malignant neoplasm of breast: Secondary | ICD-10-CM | POA: Insufficient documentation

## 2020-06-29 ENCOUNTER — Other Ambulatory Visit: Payer: Self-pay

## 2020-06-29 ENCOUNTER — Encounter: Payer: Self-pay | Admitting: Internal Medicine

## 2020-06-29 ENCOUNTER — Ambulatory Visit (INDEPENDENT_AMBULATORY_CARE_PROVIDER_SITE_OTHER): Payer: BC Managed Care – PPO | Admitting: Internal Medicine

## 2020-06-29 VITALS — BP 110/76 | HR 67 | Temp 98.1°F | Resp 15 | Ht 67.0 in | Wt 187.4 lb

## 2020-06-29 DIAGNOSIS — Z7189 Other specified counseling: Secondary | ICD-10-CM | POA: Diagnosis not present

## 2020-06-29 DIAGNOSIS — E559 Vitamin D deficiency, unspecified: Secondary | ICD-10-CM

## 2020-06-29 DIAGNOSIS — E663 Overweight: Secondary | ICD-10-CM | POA: Diagnosis not present

## 2020-06-29 DIAGNOSIS — Z23 Encounter for immunization: Secondary | ICD-10-CM | POA: Diagnosis not present

## 2020-06-29 DIAGNOSIS — T50Z95A Adverse effect of other vaccines and biological substances, initial encounter: Secondary | ICD-10-CM | POA: Diagnosis not present

## 2020-06-29 DIAGNOSIS — M2550 Pain in unspecified joint: Secondary | ICD-10-CM | POA: Diagnosis not present

## 2020-06-29 DIAGNOSIS — Z0000 Encounter for general adult medical examination without abnormal findings: Secondary | ICD-10-CM

## 2020-06-29 MED ORDER — ZOSTER VAC RECOMB ADJUVANTED 50 MCG/0.5ML IM SUSR: 0.5000 mL | Freq: Once | INTRAMUSCULAR | 1 refills | Status: AC

## 2020-06-29 NOTE — Progress Notes (Signed)
Patient ID: Penny Roberts, female    DOB: 18-Jul-1961  Age: 59 y.o. MRN: [id number]  The patient is here for annual wellness examination and management of other chronic and acute problems.  This visit occurred during the SARS-CoV-2 public health emergency.  Safety protocols were in place, including screening questions prior to the visit, additional usage of staff PPE, and extensive cleaning of exam room while observing appropriate contact time as indicated for disinfecting solutions.      The risk factors are reflected in the social history.  The roster of all physicians providing medical care to patient - is listed in the Snapshot section of the chart.  Activities of daily living:  The patient is 100% independent in all ADLs: dressing, toileting, feeding as well as independent mobility  Home safety : The patient has smoke detectors in the home. They wear seatbelts.  There are no firearms at home. There is no violence in the home.   There is no risks for hepatitis, STDs or HIV. There is no   history of blood transfusion. They have no travel history to infectious disease endemic areas of the world.  The patient has seen their dentist in the last six month. They have seen their eye doctor in the last year. They admit to slight hearing difficulty with regard to whispered voices and some television programs.  They have deferred audiologic testing in the last year.  They do not  have excessive sun exposure. Discussed the need for sun protection: hats, long sleeves and use of sunscreen if there is significant sun exposure.   Diet: the importance of a healthy diet is discussed. They do have a healthy diet.  The benefits of regular aerobic exercise were discussed. She walks 4 times per week ,  20 minutes.   Depression screen: there are no signs or vegative symptoms of depression- irritability, change in appetite, anhedonia, sadness/tearfullness.  The following portions of the patient's history  were reviewed and updated as appropriate: allergies, current medications, past family history, past medical history,  past surgical history, past social history  and problem list.  Visual acuity was not assessed per patient preference since she has regular follow up with her ophthalmologist. Hearing and body mass index were assessed and reviewed.   During the course of the visit the patient was educated and counseled about appropriate screening and preventive services including : fall prevention , diabetes screening, nutrition counseling, colorectal cancer screening, and recommended immunizations.   CC: There were no encounter diagnoses.  Caregiver stress:   Penny Roberts has been [facility]  since Dec 5 with bronchitis/ CHF,  Recover complicated by internal bleeding/hematoma caused by a ruptured chest wall muscle resulting in a large hematoma causing pain  while on coumadin   Bilateral shoulder pain:  Right shoulder started after COVIDN INJECTION.   xrays done by Ortho were normal.  She received steroid injection for subacromial bursitis.  Symptoms eventallu resolved,  But now her left shoulder has restricted ROM  Due to pain.  She is physically active   History Penny Roberts has a past medical history of Allergy, Cancer ([place]), and Chicken pox.   She has a past surgical history that includes Tubal ligation; Removed benign tumor from chest -2011 (2011); Salpingoophorectomy; Abdominal hysterectomy (2001); Colonoscopy with propofol (N/A, 09/08/2017); and polypectomy (09/08/2017).   Her family history includes Alcohol abuse in her father; Breast cancer in her maternal aunt and maternal aunt; Breast cancer (age of onset: 36) in her  Penny; Cancer in her father and maternal aunt; Heart disease in her Penny; Hypertension in her father.She reports that she quit smoking about 16 years ago. She has never used smokeless tobacco. She reports current alcohol use of about 4.0 standard drinks of alcohol per  week. She reports that she does not use drugs.  Outpatient Medications Prior to Visit  Medication Sig Dispense Refill  . Alpha-Lipoic Acid 100 MG CAPS Take 1 capsule by mouth daily.    Marland Kitchen MELATONIN PO Take 1 tablet by mouth daily.    . Multiple Vitamin (MULTIVITAMIN) capsule Take 1 capsule by mouth daily.    . traZODone (DESYREL) 50 MG tablet Take 0.5-1 tablets (25-50 mg total) by mouth at bedtime as needed for sleep. (Patient not taking: No sig reported) 90 tablet 3   No facility-administered medications prior to visit.    Review of Systems   Patient denies headache, fevers, malaise, unintentional weight loss, skin rash, eye pain, sinus congestion and sinus pain, sore throat, dysphagia,  hemoptysis , cough, dyspnea, wheezing, chest pain, palpitations, orthopnea, edema, abdominal pain, nausea, melena, diarrhea, constipation, flank pain, dysuria, hematuria, urinary  Frequency, nocturia, numbness, tingling, seizures,  Focal weakness, Loss of consciousness,  Tremor, insomnia, depression, anxiety, and suicidal ideation.      Objective:  BP 110/76 (BP Location: Left Arm, Patient Position: Sitting, Cuff Size: Normal)   Pulse 67   Temp 98.1 F (36.7 C) (Oral)   Resp 15   Ht 5\' 7"  (1.702 m)   Wt 187 lb 6.4 oz (85 kg)   SpO2 96%   BMI 29.35 kg/m   Physical Exam  General appearance: alert, cooperative and appears stated age Head: Normocephalic, without obvious abnormality, atraumatic Eyes: conjunctivae/corneas clear. PERRL, EOM's intact. Fundi benign. Ears: normal TM's and external ear canals both ears Nose: Nares normal. Septum midline. Mucosa normal. No drainage or sinus tenderness. Throat: lips, mucosa, and tongue normal; teeth and gums normal Neck: no adenopathy, no carotid bruit, no JVD, supple, symmetrical, trachea midline and thyroid not enlarged, symmetric, no tenderness/mass/nodules Lungs: clear to auscultation bilaterally Breasts: normal appearance, no masses or  tenderness Heart: regular rate and rhythm, S1, S2 normal, no murmur, click, rub or gallop Abdomen: soft, non-tender; bowel sounds normal; no masses,  no organomegaly Extremities: extremities normal, atraumatic, no cyanosis or edema Pulses: 2+ and symmetric Skin: Skin color, texture, turgor normal. No rashes or lesions Neurologic: Alert and oriented X 3, normal strength and tone. Normal symmetric reflexes. Normal coordination and gait.    Assessment & Plan:   Problem List Items Addressed This Visit   None     I have discontinued Jessel B. Macneal's traZODone. I am also having her maintain her multivitamin, MELATONIN PO, and Alpha-Lipoic Acid.  No orders of the defined types were placed in this encounter.   Medications Discontinued During This Encounter  Medication Reason  . traZODone (DESYREL) 50 MG tablet     Follow-up: No follow-ups on file.   Crecencio Mc, MD

## 2020-06-29 NOTE — Patient Instructions (Signed)
  The ShingRx vaccine is now available in local pharmacies and is much more protective than Zostavaxs,  It is therefore ADVISED for all interested adults over 50 to prevent shingles.     If your pharmacy is not going to give it until the [place],  You can go to the [facility] [phone] located  In the [place] building,  On the bottom floor (in thea back of the building)   Tdap is not due until 2024    [place]  Preferred for the booster

## 2020-06-30 DIAGNOSIS — T50Z95A Adverse effect of other vaccines and biological substances, initial encounter: Secondary | ICD-10-CM | POA: Insufficient documentation

## 2020-06-30 DIAGNOSIS — M2550 Pain in unspecified joint: Secondary | ICD-10-CM | POA: Insufficient documentation

## 2020-06-30 LAB — COMPREHENSIVE METABOLIC PANEL
ALT: 31 U/L (ref 0–35)
AST: 26 U/L (ref 0–37)
Albumin: 4.3 g/dL (ref 3.5–5.2)
Alkaline Phosphatase: 63 U/L (ref 39–117)
BUN: 16 mg/dL (ref 6–23)
CO2: 30 mEq/L (ref 19–32)
Calcium: 9.6 mg/dL (ref 8.4–10.5)
Chloride: 100 mEq/L (ref 96–112)
Creatinine, Ser: 0.7 mg/dL (ref 0.40–1.20)
GFR: 95.2 mL/min (ref >60.00)
Glucose, Bld: 80 mg/dL (ref 70–99)
Potassium: 3.9 mEq/L (ref 3.5–5.1)
Sodium: 141 mEq/L (ref 135–145)
Total Bilirubin: 0.6 mg/dL (ref 0.2–1.2)
Total Protein: 6.9 g/dL (ref 6.0–8.3)

## 2020-06-30 LAB — VITAMIN D 25 HYDROXY (VIT D DEFICIENCY, FRACTURES): VITD: 40.33 ng/mL (ref 30.00–100.00)

## 2020-06-30 LAB — LIPID PANEL
Cholesterol: 269 mg/dL — ABNORMAL HIGH (ref 0–200)
HDL: 91.5 mg/dL (ref >39.00)
LDL Cholesterol: 154 mg/dL — ABNORMAL HIGH (ref 0–99)
NonHDL: 177.54
Total CHOL/HDL Ratio: 3
Triglycerides: 119 mg/dL (ref 0.0–149.0)
VLDL: 23.8 mg/dL (ref 0.0–40.0)

## 2020-06-30 LAB — SEDIMENTATION RATE: Sed Rate: 19 mm/hr (ref 0–30)

## 2020-06-30 LAB — TSH: TSH: 1.21 u[IU]/mL (ref 0.35–4.50)

## 2020-06-30 NOTE — Assessment & Plan Note (Signed)

## 2020-06-30 NOTE — Assessment & Plan Note (Signed)
I have addressed  BMI and recommended a low glycemic index diet utilizing smaller more frequent meals to increase metabolism.  I have also recommended that patient start exercising with a goal of 30 minutes of aerobic exercise a minimum of 5 days per week. Screening for lipid disorders, thyroid and diabetes has been done today.  Lab Results  Component Value Date   TSH 1.21 06/29/2020   Lab Results  Component Value Date   CHOL 269 (H) 06/29/2020   HDL 91.50 06/29/2020   LDLCALC 154 (H) 06/29/2020   LDLDIRECT 116.4 09/17/2012   TRIG 119.0 06/29/2020   CHOLHDL 3 06/29/2020   No results found for: HGBA1C

## 2020-06-30 NOTE — Progress Notes (Signed)
Your labs,  including your thyroid function,  vitamin d, cholesterol and your sedimentation rate are normal.  (normal sedimentation  rate essentially rules out inflammation )  Regards,   Deborra Medina, MD

## 2020-06-30 NOTE — Assessment & Plan Note (Addendum)
She has had prolonged bilateral shoulder pain since her COVID vaccinations.  Reviewed history,  ESR is normal.  Reassurance provided that her symptoms suggest OA bs DJD , not myositis or arthritis.  Lab Results  Component Value Date   ESRSEDRATE 19 06/29/2020    

## 2020-09-23 ENCOUNTER — Telehealth (INDEPENDENT_AMBULATORY_CARE_PROVIDER_SITE_OTHER): Payer: BC Managed Care – PPO | Admitting: Internal Medicine

## 2020-09-23 ENCOUNTER — Encounter: Payer: Self-pay | Admitting: Internal Medicine

## 2020-09-23 DIAGNOSIS — L03116 Cellulitis of left lower limb: Secondary | ICD-10-CM | POA: Insufficient documentation

## 2020-09-23 MED ORDER — SULFAMETHOXAZOLE-TRIMETHOPRIM 800-160 MG PO TABS: 1.0000 | ORAL_TABLET | Freq: Two times a day (BID) | ORAL | 0 refills | Status: DC

## 2020-09-23 NOTE — Progress Notes (Signed)
Virtual Visit via [place]  This visit type was conducted due to national recommendations for restrictions regarding the COVID-19 pandemic (e.g. social distancing).  This format is felt to be most appropriate for this patient at this time.  All issues noted in this document were discussed and addressed.  No physical exam was performed (except for noted visual exam findings with Video Visits).   I connected with@ on 09/23/20 at  8:00 AM EST by a video enabled telemedicine application  and verified that I am speaking with the correct person using two identifiers. Location patient: home Location provider: work or home office Persons participating in the virtual visit: patient, provider  I discussed the limitations, risks, security and privacy concerns of performing an evaluation and management service by telephone and the availability of in person appointments. I also discussed with the patient that there may be a patient responsible charge related to this service. The patient expressed understanding and agreed to proceed.  Reason for visit: cellulitis  HPI: 60 yr old female presents with signs of cellulitis involving left ankle after scratching a bee sting that occurred on Sunday.  Patient notes that one hour after scratching the area on Monday, se developed redness and swelling and the line of demarcation has progressed proximally and distally.  The area is warmer than the surrounding area and contralateral  ankle. No fevers.  Tetanus vaccine up to date    ROS: See pertinent positives and negatives per HPI.  Past Medical History:  Diagnosis Date  . Allergy   . Cancer ([place])    skin  . Chicken pox     Past Surgical History:  Procedure Laterality Date  . ABDOMINAL HYSTERECTOMY  2001   due to emdometriosis  . COLONOSCOPY WITH PROPOFOL N/A 09/08/2017   Procedure: COLONOSCOPY WITH PROPOFOL;  Surgeon: Lucilla Lame, MD;  Location: [facility];  Service: Endoscopy;  Laterality: N/A;   . POLYPECTOMY  09/08/2017   Procedure: POLYPECTOMY;  Surgeon: Lucilla Lame, MD;  Location: [place];  Service: Endoscopy;;  . 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 SALPINGOOPHORECTOMY    . TUBAL LIGATION      Family History  Problem Relation Age of Onset  . Heart disease Mother        A-Fib  . Breast cancer Mother 56  . Cancer Father        colon cancer  . Alcohol abuse Father   . Hypertension Father   . Cancer Maternal Aunt        breast cancer  . Breast cancer Maternal Aunt   . Breast cancer Maternal Aunt     SOCIAL HX:  reports that she quit smoking about 16 years ago. She has never used smokeless tobacco. She reports current alcohol use of about 4.0 standard drinks of alcohol per week. She reports that she does not use drugs.   Current Outpatient Medications:  .  Alpha-Lipoic Acid 100 MG CAPS, Take 1 capsule by mouth daily., Disp: , Rfl:  .  MELATONIN PO, Take 1 tablet by mouth daily., Disp: , Rfl:  .  Multiple Vitamin (MULTIVITAMIN) capsule, Take 1 capsule by mouth daily., Disp: , Rfl:  .  sulfamethoxazole-trimethoprim (BACTRIM DS) 800-160 MG tablet, Take 1 tablet by mouth 2 (two) times daily., Disp: 14 tablet, Rfl: 0  EXAM:  VITALS per patient if applicable:  GENERAL: alert, oriented, appears well and in no acute distress  HEENT: atraumatic, conjunttiva clear, no  obvious abnormalities on inspection of external nose and ears  NECK: normal movements of the head and neck  LUNGS: on inspection no signs of respiratory distress, breathing rate appears normal, no obvious gross SOB, gasping or wheezing  CV: no obvious cyanosis  MS: moves all visible extremities without noticeable abnormality Skin:  Left ankle with swelling,  Mild erythema   PSYCH/NEURO: pleasant and cooperative, no obvious depression or anxiety, speech and thought processing grossly intact  ASSESSMENT AND PLAN:  Discussed the following assessment and  plan:  Cellulitis of left ankle  Cellulitis of left ankle Occurring after vigoruous scratching of a bee sting that occurred one day prior.  Empiric Septra DS prescribed.  Patient advised to notify office if redness progresses proximally or distally 24 hours after starting Septra Ds.  Daily use of a probiotic advised for 3 weeks.     I discussed the assessment and treatment plan with the patient. The patient was provided an opportunity to ask questions and all were answered. The patient agreed with the plan and demonstrated an understanding of the instructions.   The patient was advised to call back or seek an in-person evaluation if the symptoms worsen or if the condition fails to improve as anticipated.   I spent 30 minutes dedicated to the care of this patient on the date of this encounter to include pre-visit review of his medical history,  Face-to-face time with the patient , and post visit ordering of testing and therapeutics.    Crecencio Mc, MD

## 2020-09-23 NOTE — Assessment & Plan Note (Signed)
Occurring after vigoruous scratching of a bee sting that occurred one day prior.  Empiric Septra DS prescribed.  Patient advised to notify office if redness progresses proximally or distally 24 hours after starting Septra Ds.  Daily use of a probiotic advised for 3 weeks.

## 2020-09-23 NOTE — Telephone Encounter (Signed)
Pt had VV today with PCP.

## 2021-06-07 ENCOUNTER — Other Ambulatory Visit: Payer: Self-pay | Admitting: Internal Medicine

## 2021-06-07 DIAGNOSIS — Z1231 Encounter for screening mammogram for malignant neoplasm of breast: Secondary | ICD-10-CM

## 2021-06-30 ENCOUNTER — Encounter: Payer: BC Managed Care – PPO | Admitting: Internal Medicine

## 2021-07-16 ENCOUNTER — Encounter: Payer: BC Managed Care – PPO | Admitting: Internal Medicine

## 2021-08-16 ENCOUNTER — Ambulatory Visit
Admission: RE | Admit: 2021-08-16 | Discharge: 2021-08-16 | Disposition: A | Discharge status: Home / Self Care | Payer: BC Managed Care – PPO | Source: Ambulatory Visit | Attending: Internal Medicine | Admitting: Internal Medicine

## 2021-08-16 ENCOUNTER — Other Ambulatory Visit: Payer: Self-pay

## 2021-08-16 DIAGNOSIS — Z1231 Encounter for screening mammogram for malignant neoplasm of breast: Secondary | ICD-10-CM | POA: Diagnosis not present

## 2021-08-17 ENCOUNTER — Encounter: Payer: Self-pay | Admitting: Internal Medicine

## 2021-08-17 ENCOUNTER — Ambulatory Visit (INDEPENDENT_AMBULATORY_CARE_PROVIDER_SITE_OTHER): Payer: BC Managed Care – PPO | Admitting: Internal Medicine

## 2021-08-17 ENCOUNTER — Other Ambulatory Visit: Payer: Self-pay

## 2021-08-17 VITALS — BP 118/68 | HR 64 | Temp 97.7°F | Ht 67.0 in | Wt 185.8 lb

## 2021-08-17 DIAGNOSIS — Z23 Encounter for immunization: Secondary | ICD-10-CM | POA: Diagnosis not present

## 2021-08-17 DIAGNOSIS — Z0000 Encounter for general adult medical examination without abnormal findings: Secondary | ICD-10-CM | POA: Diagnosis not present

## 2021-08-17 MED ORDER — ZOSTER VAC RECOMB ADJUVANTED 50 MCG/0.5ML IM SUSR: 0.5000 mL | Freq: Once | INTRAMUSCULAR | 1 refills | Status: AC

## 2021-08-17 MED ORDER — RED YEAST RICE 600 MG PO CAPS: 1.0000 | ORAL_CAPSULE | Freq: Two times a day (BID) | ORAL | 11 refills | Status: DC

## 2021-08-17 NOTE — Progress Notes (Signed)
The patient is here for annual preventive examination and management of other chronic and acute problems.   The risk factors are reflected in the social history.   The roster of all physicians providing medical care to patient - is listed in the Snapshot section of the chart.   Activities of daily living:  The patient is 100% independent in all ADLs: dressing, toileting, feeding as well as independent mobility   Home safety : The patient has smoke detectors in the home. They wear seatbelts.  There are no unsecured firearms at home. There is no violence in the home.    There is no risks for hepatitis, STDs or HIV. There is no   history of blood transfusion. They have no travel history to infectious disease endemic areas of the world.   The patient has seen their dentist in the last six month. They have seen their eye doctor in the last year. The patinet  denies slight hearing difficulty with regard to whispered voices and some television programs.  They have deferred audiologic testing in the last year.  They do not  have excessive sun exposure. Discussed the need for sun protection: hats, long sleeves and use of sunscreen if there is significant sun exposure.    Diet: the importance of a healthy diet is discussed. They do have a healthy diet.   The benefits of regular aerobic exercise were discussed. The patient  exercises  3 to 5 days per week  for  60 minutes.    Depression screen: there are no signs or vegative symptoms of depression- irritability, change in appetite, anhedonia, sadness/tearfullness.   The following portions of the patient's history were reviewed and updated as appropriate: allergies, current medications, past family history, past medical history,  past surgical history, past social history  and problem list.   Visual acuity was not assessed per patient preference since the patient has regular follow up with an  ophthalmologist. Hearing and body mass index were assessed and  reviewed.    During the course of the visit the patient was educated and counseled about appropriate screening and preventive services including : fall prevention , diabetes screening, nutrition counseling, colorectal cancer screening, and recommended immunizations.    Chief Complaint:  HPI     Annual Exam    Additional comments: Physical       Last edited by Adair Laundry, Hunt on 08/17/2021  3:26 PM.        Review of Symptoms  Patient denies headache, fevers, malaise, unintentional weight loss, skin rash, eye pain, sinus congestion and sinus pain, sore throat, dysphagia,  hemoptysis , cough, dyspnea, wheezing, chest pain, palpitations, orthopnea, edema, abdominal pain, nausea, melena, diarrhea, constipation, flank pain, dysuria, hematuria, urinary  Frequency, nocturia, numbness, tingling, seizures,  Focal weakness, Loss of consciousness,  Tremor, insomnia, depression, anxiety, and suicidal ideation.    Physical Exam:  BP 118/68 (BP Location: Left Arm, Patient Position: Sitting, Cuff Size: Large)    Pulse 64    Temp 97.7 F (36.5 C) (Oral)    Ht 5\' 7"  (1.702 m)    Wt 185 lb 12.8 oz (84.3 kg)    SpO2 97%    BMI 29.10 kg/m    General appearance: alert, cooperative and appears stated age Ears: normal TM's and external ear canals both ears Throat: lips, mucosa, and tongue normal; teeth and gums normal Neck: no adenopathy, no carotid bruit, supple, symmetrical, trachea midline and thyroid not enlarged, symmetric, no tenderness/mass/nodules Back: symmetric, no  curvature. ROM normal. No CVA tenderness. Lungs: clear to auscultation bilaterally Heart: regular rate and rhythm, S1, S2 normal, no murmur, click, rub or gallop Abdomen: soft, non-tender; bowel sounds normal; no masses,  no organomegaly Pulses: 2+ and symmetric Skin: Skin color, texture, turgor normal. No rashes or lesions Lymph nodes: Cervical, supraclavicular, and axillary nodes normal.   Assessment and  Plan:  Encounter for preventive health examination age appropriate education and counseling updated, referrals for preventative services and immunizations addressed, dietary and smoking counseling addressed, most recent labs reviewed.  I have personally reviewed and have noted:   1) the patient's medical and social history 2) The pt's use of alcohol, tobacco, and illicit drugs 3) The patient's current medications and supplements 4) Functional ability including ADL's, fall risk, home safety risk, hearing and visual impairment 5) Diet and physical activities 6) Evidence for depression or mood disorder 7) The patient's height, weight, and BMI have been recorded in the chart I have made referrals, and provided counseling and education based on review of the above   Updated Medication List Outpatient Encounter Medications as of 08/17/2021  Medication Sig   B Complex Vitamins (B-COMPLEX/B-12) LIQD    MELATONIN PO Take 1 tablet by mouth daily.   Multiple Vitamin (MULTIVITAMIN) capsule Take 1 capsule by mouth daily.   Omega-3 Fatty Acids (FISH OIL) 1000 MG CAPS    Red Yeast Rice 600 MG CAPS Take 1 capsule (600 mg total) by mouth 2 (two) times daily.   Zoster Vaccine Adjuvanted [facility]) injection Inject 0.5 mLs into the muscle once for 1 dose.   [DISCONTINUED] Alpha-Lipoic Acid 100 MG CAPS Take 1 capsule by mouth daily.   [DISCONTINUED] sulfamethoxazole-trimethoprim (BACTRIM DS) 800-160 MG tablet Take 1 tablet by mouth 2 (two) times daily.   No facility-administered encounter medications on file as of 08/17/2021.

## 2021-08-17 NOTE — Patient Instructions (Addendum)
Your mammogram was normal (yesterday)  Colonoscopy is due next year (2024)  The ShingRx vaccine is now available in local pharmacies and is much more protective than Zostavaxs,  It is therefore ADVISED for all interested adults over 50 to prevent shingles.

## 2021-08-17 NOTE — Assessment & Plan Note (Signed)

## 2021-08-18 LAB — CBC WITH DIFFERENTIAL/PLATELET
Basophils Absolute: 0.1 10*3/uL (ref 0.0–0.1)
Basophils Relative: 1.3 % (ref 0.0–3.0)
Eosinophils Absolute: 0.2 10*3/uL (ref 0.0–0.7)
Eosinophils Relative: 3 % (ref 0.0–5.0)
HCT: 42.4 % (ref 36.0–46.0)
Hemoglobin: 14 g/dL (ref 12.0–15.0)
Lymphocytes Relative: 29 % (ref 12.0–46.0)
Lymphs Abs: 1.5 10*3/uL (ref 0.7–4.0)
MCHC: 33 g/dL (ref 30.0–36.0)
MCV: 91 fl (ref 78.0–100.0)
Monocytes Absolute: 0.5 10*3/uL (ref 0.1–1.0)
Monocytes Relative: 9.3 % (ref 3.0–12.0)
Neutro Abs: 3.1 10*3/uL (ref 1.4–7.7)
Neutrophils Relative %: 57.4 % (ref 43.0–77.0)
Platelets: 235 10*3/uL (ref 150.0–400.0)
RBC: 4.65 Mil/uL (ref 3.87–5.11)
RDW: 13.7 % (ref 11.5–15.5)
WBC: 5.3 10*3/uL (ref 4.0–10.5)

## 2021-08-18 LAB — COMPREHENSIVE METABOLIC PANEL
ALT: 23 U/L (ref 0–35)
AST: 25 U/L (ref 0–37)
Albumin: 4.3 g/dL (ref 3.5–5.2)
Alkaline Phosphatase: 58 U/L (ref 39–117)
BUN: 11 mg/dL (ref 6–23)
CO2: 32 mEq/L (ref 19–32)
Calcium: 9.5 mg/dL (ref 8.4–10.5)
Chloride: 102 mEq/L (ref 96–112)
Creatinine, Ser: 0.74 mg/dL (ref 0.40–1.20)
GFR: 88.35 mL/min (ref >60.00)
Glucose, Bld: 87 mg/dL (ref 70–99)
Potassium: 4.2 mEq/L (ref 3.5–5.1)
Sodium: 141 mEq/L (ref 135–145)
Total Bilirubin: 0.6 mg/dL (ref 0.2–1.2)
Total Protein: 6.8 g/dL (ref 6.0–8.3)

## 2021-08-18 LAB — LIPID PANEL
Cholesterol: 261 mg/dL — ABNORMAL HIGH (ref 0–200)
HDL: 102.2 mg/dL (ref >39.00)
LDL Cholesterol: 144 mg/dL — ABNORMAL HIGH (ref 0–99)
NonHDL: 158.44
Total CHOL/HDL Ratio: 3
Triglycerides: 71 mg/dL (ref 0.0–149.0)
VLDL: 14.2 mg/dL (ref 0.0–40.0)

## 2021-08-18 LAB — TSH: TSH: 0.97 u[IU]/mL (ref 0.35–5.50)

## 2021-08-18 LAB — HEMOGLOBIN A1C: Hgb A1c MFr Bld: 5.4 % (ref 4.6–6.5)

## 2021-10-08 ENCOUNTER — Encounter: Payer: Self-pay | Admitting: Internal Medicine

## 2021-10-12 ENCOUNTER — Telehealth (INDEPENDENT_AMBULATORY_CARE_PROVIDER_SITE_OTHER): Payer: BC Managed Care – PPO | Admitting: Internal Medicine

## 2021-10-12 ENCOUNTER — Encounter: Payer: Self-pay | Admitting: Internal Medicine

## 2021-10-12 DIAGNOSIS — J301 Allergic rhinitis due to pollen: Secondary | ICD-10-CM | POA: Diagnosis not present

## 2021-10-12 MED ORDER — AMOXICILLIN-POT CLAVULANATE 875-125 MG PO TABS: 1.0000 | ORAL_TABLET | Freq: Two times a day (BID) | ORAL | 0 refills | Status: DC

## 2021-10-12 MED ORDER — CHERATUSSIN AC 100-10 MG/5ML PO SOLN: 5.0000 mL | Freq: Three times a day (TID) | ORAL | 0 refills | Status: DC | PRN

## 2021-10-12 MED ORDER — PREDNISONE 10 MG PO TABS: ORAL_TABLET | ORAL | 0 refills | Status: DC

## 2021-10-12 NOTE — Patient Instructions (Signed)
Start the prednisone taper  today ? ?Increase allegra to  every 12 hours \ ? ?Continue flonase ? ?Use the Cheratussin cough syrup at night.  Contains codeine.  You can add benadryl if not strong enough.  ? ?Add augmentin (the antibiotic)  ONLY IF  you develop symptoms of otitis or sinusitis: ? ?Facial pain ?Ear pain ?Temp > 100.4 ?Greenish or blood streaked nasal discharge  ? ?OR IF SINUS CONGESTION IS ? NOT IMPROVED  BY Thursday  ? ?For congestion: ? ?You can use sudafed PE  Up to 30 mg every 6 hours if needed for the congestion in your ears, and substitute Afrin nasal spray for the evening dose to avoid insomnia.  ? ?For allergies: ? ?generic zyrtec is called cetirizine.   ?Allegra is available generically as fexofenadine  ? ?Both can be taken every 12 hours for allergies  ; they are perfectly safe ? ?Consider flushing sinuses with neil med's sinus rinse once you [place]  [place]  ?

## 2021-10-12 NOTE — Progress Notes (Signed)
Virtual Visit via [facility] ? ?This visit type was conducted due to national recommendations for restrictions regarding the COVID-19 pandemic (e.g. social distancing).  This format is felt to be most appropriate for this patient at this time.  All issues noted in this document were discussed and addressed.  No physical exam was performed (except for noted visual exam findings with Video Visits).  ? ?I connected withNAME@ on 10/12/21 at 11:15 AM EDT by a video enabled telemedicine application or telephone and verified that I am speaking with the correct person using two identifiers. ?Location patient: home ?Location provider: work or home office ?Persons participating in the virtual visit: patient, provider ? ?I discussed the limitations, risks, security and privacy concerns of performing an evaluation and management service by telephone and the availability of in person appointments. I also discussed with the patient that there may be a patient responsible charge related to this service. The patient expressed understanding and agreed to proceed. ? ? ?Reason for visit: cough, congestion,  fullness in ears  ? ?HPI: ? ?61 yr old presents with symptoms of  severe allergies since March 24 .  Facial  pain  on Friday ,  which has resolvedm  but still coughing a lot at night,  at times feels like she is wheezing.   ears feel congested and still sneezing a lot.  Using allegra and flonase.   ? ? ?ROS: See pertinent positives and negatives per HPI. ? ?Past Medical History:  ?Diagnosis Date  ? Allergy   ? Cancer [facility])   ? skin  ? Chicken pox   ? ? ?Past Surgical History:  ?Procedure Laterality Date  ? ABDOMINAL HYSTERECTOMY  2001  ? due to emdometriosis  ? COLONOSCOPY WITH PROPOFOL N/A 09/08/2017  ? Procedure: COLONOSCOPY WITH PROPOFOL;  Surgeon: Lucilla Lame, MD;  Location: [place];  Service: Endoscopy;  Laterality: N/A;  ? POLYPECTOMY  09/08/2017  ? Procedure: POLYPECTOMY;  Surgeon: Lucilla Lame, MD;   Location: [place];  Service: Endoscopy;;  ? Removed benign tumor from chest -2011  2011  ? thymus benign tumor  ? SALPINGOOPHORECTOMY    ? TUBAL LIGATION    ? ? ?Family History  ?Problem Relation Age of Onset  ? Heart disease Mother   ?     A-Fib  ? Breast cancer Mother 52  ? Cancer Father   ?     colon cancer  ? Alcohol abuse Father   ? Hypertension Father   ? Cancer Maternal Aunt   ?     breast cancer  ? Breast cancer Maternal Aunt   ? Breast cancer Maternal Aunt   ? ? ?SOCIAL HX:  reports that she quit smoking about 17 years ago. Her smoking use included cigarettes. She has never used smokeless tobacco. She reports current alcohol use of about 4.0 standard drinks per week. She reports that she does not use drugs.  ? ? ?Current Outpatient Medications:  ?  amoxicillin-clavulanate (AUGMENTIN) 875-125 MG tablet, Take 1 tablet by mouth 2 (two) times daily., Disp: 14 tablet, Rfl: 0 ?  B Complex Vitamins (B-COMPLEX/B-12) LIQD, , Disp: , Rfl:  ?  guaiFENesin-codeine (CHERATUSSIN AC) 100-10 MG/5ML syrup, Take 5 mLs by mouth 3 (three) times daily as needed for cough., Disp: 120 mL, Rfl: 0 ?  MELATONIN PO, Take 1 tablet by mouth daily., Disp: , Rfl:  ?  Multiple Vitamin (MULTIVITAMIN) capsule, Take 1 capsule by mouth daily., Disp: , Rfl:  ?  Omega-3  Fatty Acids (FISH OIL) 1000 MG CAPS, , Disp: , Rfl:  ?  predniSONE (DELTASONE) 10 MG tablet, 6 tablets on Day 1 , then reduce by 1 tablet daily until gone, Disp: 21 tablet, Rfl: 0 ?  Red Yeast Rice 600 MG CAPS, Take 1 capsule (600 mg total) by mouth 2 (two) times daily., Disp: 60 capsule, Rfl: 11 ? ?EXAM: ? ?VITALS per patient if applicable: ? ?GENERAL: alert, oriented, appears well and in no acute distress ? ?HEENT: atraumatic, conjunttiva clear, no obvious abnormalities on inspection of external nose and ears ? ?NECK: normal movements of the head and neck ? ?LUNGS: on inspection no signs of respiratory distress, breathing rate appears normal, no obvious gross SOB,  gasping or wheezing ? ?CV: no obvious cyanosis ? ?MS: moves all visible extremities without noticeable abnormality ? ?PSYCH/NEURO: pleasant and cooperative, no obvious depression or anxiety, speech and thought processing grossly intact ? ?ASSESSMENT AND PLAN: ? ?Discussed the following assessment and plan: ? ?Allergic rhinitis due to grass pollen ? ?Allergic rhinitis due to grass pollen ?Starting prednisone taper.  Increase allegra to bid  Continue flonase.  Add augmentin if symptoms of otitis or sinusitis develop  ? ?  ?I discussed the assessment and treatment plan with the patient. The patient was provided an opportunity to ask questions and all were answered. The patient agreed with the plan and demonstrated an understanding of the instructions. ?  ?The patient was advised to call back or seek an in-person evaluation if the symptoms worsen or if the condition fails to improve as anticipated. ? ? ?I spent 30 minutes dedicated to the care of this patient on the date of this encounter to include pre-visit review of his medical history,  Face-to-face time with the patient , and post visit ordering of testing and therapeutics.  ? ? ?Crecencio Mc, MD   ?

## 2021-10-12 NOTE — Assessment & Plan Note (Signed)
Starting prednisone taper.  Increase allegra to bid  Continue flonase.  Add augmentin if symptoms of otitis or sinusitis develop  ?

## 2021-12-06 ENCOUNTER — Ambulatory Visit: Payer: BC Managed Care – PPO | Admitting: Internal Medicine

## 2022-08-18 ENCOUNTER — Ambulatory Visit (INDEPENDENT_AMBULATORY_CARE_PROVIDER_SITE_OTHER): Payer: BC Managed Care – PPO | Admitting: Internal Medicine

## 2022-08-18 ENCOUNTER — Encounter: Payer: Self-pay | Admitting: Internal Medicine

## 2022-08-18 VITALS — BP 112/64 | HR 62 | Temp 97.9°F | Ht 67.0 in | Wt 193.8 lb

## 2022-08-18 DIAGNOSIS — E785 Hyperlipidemia, unspecified: Secondary | ICD-10-CM

## 2022-08-18 DIAGNOSIS — Z23 Encounter for immunization: Secondary | ICD-10-CM | POA: Diagnosis not present

## 2022-08-18 DIAGNOSIS — R7301 Impaired fasting glucose: Secondary | ICD-10-CM

## 2022-08-18 DIAGNOSIS — R5383 Other fatigue: Secondary | ICD-10-CM | POA: Diagnosis not present

## 2022-08-18 DIAGNOSIS — Z0000 Encounter for general adult medical examination without abnormal findings: Secondary | ICD-10-CM | POA: Diagnosis not present

## 2022-08-18 DIAGNOSIS — Z1231 Encounter for screening mammogram for malignant neoplasm of breast: Secondary | ICD-10-CM

## 2022-08-18 LAB — CBC WITH DIFFERENTIAL/PLATELET
Basophils Absolute: 0.1 10*3/uL (ref 0.0–0.1)
Basophils Relative: 1.1 % (ref 0.0–3.0)
Eosinophils Absolute: 0.3 10*3/uL (ref 0.0–0.7)
Eosinophils Relative: 5.4 % — ABNORMAL HIGH (ref 0.0–5.0)
HCT: 42.6 % (ref 36.0–46.0)
Hemoglobin: 14.2 g/dL (ref 12.0–15.0)
Lymphocytes Relative: 30.8 % (ref 12.0–46.0)
Lymphs Abs: 1.6 10*3/uL (ref 0.7–4.0)
MCHC: 33.4 g/dL (ref 30.0–36.0)
MCV: 90.7 fl (ref 78.0–100.0)
Monocytes Absolute: 0.5 10*3/uL (ref 0.1–1.0)
Monocytes Relative: 9.2 % (ref 3.0–12.0)
Neutro Abs: 2.7 10*3/uL (ref 1.4–7.7)
Neutrophils Relative %: 53.5 % (ref 43.0–77.0)
Platelets: 221 10*3/uL (ref 150.0–400.0)
RBC: 4.69 Mil/uL (ref 3.87–5.11)
RDW: 13.8 % (ref 11.5–15.5)
WBC: 5.1 10*3/uL (ref 4.0–10.5)

## 2022-08-18 LAB — COMPREHENSIVE METABOLIC PANEL
ALT: 24 U/L (ref 0–35)
AST: 22 U/L (ref 0–37)
Albumin: 4.1 g/dL (ref 3.5–5.2)
Alkaline Phosphatase: 58 U/L (ref 39–117)
BUN: 18 mg/dL (ref 6–23)
CO2: 30 mEq/L (ref 19–32)
Calcium: 9 mg/dL (ref 8.4–10.5)
Chloride: 104 mEq/L (ref 96–112)
Creatinine, Ser: 0.68 mg/dL (ref 0.40–1.20)
GFR: 94.44 mL/min (ref >60.00)
Glucose, Bld: 77 mg/dL (ref 70–99)
Potassium: 4.2 mEq/L (ref 3.5–5.1)
Sodium: 141 mEq/L (ref 135–145)
Total Bilirubin: 0.4 mg/dL (ref 0.2–1.2)
Total Protein: 6.4 g/dL (ref 6.0–8.3)

## 2022-08-18 LAB — LIPID PANEL
Cholesterol: 259 mg/dL — ABNORMAL HIGH (ref 0–200)
HDL: 80.7 mg/dL (ref >39.00)
LDL Cholesterol: 143 mg/dL — ABNORMAL HIGH (ref 0–99)
NonHDL: 178.27
Total CHOL/HDL Ratio: 3
Triglycerides: 178 mg/dL — ABNORMAL HIGH (ref 0.0–149.0)
VLDL: 35.6 mg/dL (ref 0.0–40.0)

## 2022-08-18 LAB — HEMOGLOBIN A1C: Hgb A1c MFr Bld: 5.5 % (ref 4.6–6.5)

## 2022-08-18 LAB — LDL CHOLESTEROL, DIRECT: Direct LDL: 134 mg/dL

## 2022-08-18 LAB — TSH: TSH: 1.19 u[IU]/mL (ref 0.35–5.50)

## 2022-08-18 NOTE — Progress Notes (Signed)
Patient ID: Penny Roberts, female    DOB: 1960-12-26  Age: 62 y.o. MRN: [id number]  The patient is here for annual preventive examination and management of other chronic and acute problems.   The risk factors are reflected in the social history.   The roster of all physicians providing medical care to patient - is listed in the Snapshot section of the chart.   Activities of daily living:  The patient is 100% independent in all ADLs: dressing, toileting, feeding as well as independent mobility   Home safety : The patient has smoke detectors in the home. They wear seatbelts.  There are no unsecured firearms at home. There is no violence in the home.    There is no risks for hepatitis, STDs or HIV. There is no   history of blood transfusion. They have no travel history to infectious disease endemic areas of the world.   The patient has seen their dentist in the last six month. They have seen their eye doctor in the last year. The patinet  denies slight hearing difficulty with regard to whispered voices and some television programs.  They have deferred audiologic testing in the last year.  They do not  have excessive sun exposure. Discussed the need for sun protection: hats, long sleeves and use of sunscreen if there is significant sun exposure.    Diet: the importance of a healthy diet is discussed. They do have a healthy diet.   The benefits of regular aerobic exercise were discussed. The patient  exercises  3 to 5 days per week  for  60 minutes.    Depression screen: there are no signs or vegative symptoms of depression- irritability, change in appetite, anhedonia, sadness/tearfullness.   The following portions of the patient's history were reviewed and updated as appropriate: allergies, current medications, past family history, past medical history,  past surgical history, past social history  and problem list.   Visual acuity was not assessed per patient preference since the patient  has regular follow up with an  ophthalmologist. Hearing and body mass index were assessed and reviewed.    During the course of the visit the patient was educated and counseled about appropriate screening and preventive services including : fall prevention , diabetes screening, nutrition counseling, colorectal cancer screening, and recommended immunizations.    Chief Complaint:  None   Review of Symptoms  Patient denies headache, fevers, malaise, unintentional weight loss, skin rash, eye pain, sinus congestion and sinus pain, sore throat, dysphagia,  hemoptysis , cough, dyspnea, wheezing, chest pain, palpitations, orthopnea, edema, abdominal pain, nausea, melena, diarrhea, constipation, flank pain, dysuria, hematuria, urinary  Frequency, nocturia, numbness, tingling, seizures,  Focal weakness, Loss of consciousness,  Tremor, insomnia, depression, anxiety, and suicidal ideation.    Physical Exam:  BP 112/64   Pulse 62   Temp 97.9 F (36.6 C) (Oral)   Ht '5\' 7"'$  (1.702 m)   Wt 193 lb 12.8 oz (87.9 kg)   SpO2 96%   BMI 30.35 kg/m    Physical Exam Vitals reviewed.  Constitutional:      General: She is not in acute distress.    Appearance: Normal appearance. She is well-developed and normal weight. She is not ill-appearing, toxic-appearing or diaphoretic.  HENT:     Head: Normocephalic.     Right Ear: Tympanic membrane, ear canal and external ear normal. There is no impacted cerumen.     Left Ear: Tympanic membrane, ear canal and external ear normal. There  is no impacted cerumen.     Nose: Nose normal.     Mouth/Throat:     Mouth: Mucous membranes are moist.     Pharynx: Oropharynx is clear.  Eyes:     General: No scleral icterus.       Right eye: No discharge.        Left eye: No discharge.     Conjunctiva/sclera: Conjunctivae normal.     Pupils: Pupils are equal, round, and reactive to light.  Neck:     Thyroid: No thyromegaly.     Vascular: No carotid bruit or JVD.   Cardiovascular:     Rate and Rhythm: Normal rate and regular rhythm.     Heart sounds: Normal heart sounds.  Pulmonary:     Effort: Pulmonary effort is normal. No respiratory distress.     Breath sounds: Normal breath sounds.  Chest:  Breasts:    Breasts are symmetrical.     Right: Normal. No swelling, inverted nipple, mass, nipple discharge, skin change or tenderness.     Left: Normal. No swelling, inverted nipple, mass, nipple discharge, skin change or tenderness.  Abdominal:     General: Bowel sounds are normal.     Palpations: Abdomen is soft. There is no mass.     Tenderness: There is no abdominal tenderness. There is no guarding or rebound.  Musculoskeletal:        General: Normal range of motion.     Cervical back: Normal range of motion and neck supple.  Lymphadenopathy:     Cervical: No cervical adenopathy.     Upper Body:     Right upper body: No supraclavicular, axillary or pectoral adenopathy.     Left upper body: No supraclavicular, axillary or pectoral adenopathy.  Skin:    General: Skin is warm and dry.  Neurological:     General: No focal deficit present.     Mental Status: She is alert and oriented to person, place, and time. Mental status is at baseline.  Psychiatric:        Mood and Affect: Mood normal.        Behavior: Behavior normal.        Thought Content: Thought content normal.        Judgment: Judgment normal.     Assessment and Plan: Encounter for preventive health examination Assessment & Plan: age appropriate education and counseling updated, referrals for preventative services and immunizations addressed, dietary and smoking counseling addressed, most recent labs reviewed.  I have personally reviewed and have noted:   1) the patient's medical and social history 2) The pt's use of alcohol, tobacco, and illicit drugs 3) The patient's current medications and supplements 4) Functional ability including ADL's, fall risk, home safety risk, hearing  and visual impairment 5) Diet and physical activities 6) Evidence for depression or mood disorder 7) The patient's height, weight, and BMI have been recorded in the chart  I have made referrals, and provided counseling and education based on review of the above    Hyperlipidemia, unspecified hyperlipidemia type Assessment & Plan: 10 yr risk of CAD using the AHA risk calculator is 6%.  Patient has no incidental evidence of atherosclerosis .  No treatment advised at this time    Lab Results  Component Value Date   CHOL 259 (H) 08/18/2022   HDL 80.70 08/18/2022   LDLCALC 143 (H) 08/18/2022   LDLDIRECT 134.0 08/18/2022   TRIG 178.0 (H) 08/18/2022   CHOLHDL 3 08/18/2022  Orders: -     Lipid panel -     LDL cholesterol, direct  Other fatigue Assessment & Plan: Screening labs normal.  No history of snoring.  Recommended participating in regular exercise program with goal of achieving a minimum of 30 minutes of aerobic activity 5 days per week.   Lab Results  Component Value Date   WBC 5.1 08/18/2022   HGB 14.2 08/18/2022   HCT 42.6 08/18/2022   MCV 90.7 08/18/2022   PLT 221.0 08/18/2022   Lab Results  Component Value Date   TSH 1.19 08/18/2022     Orders: -     CBC with Differential/Platelet -     TSH  Impaired fasting glucose -     Hemoglobin A1c -     Comprehensive metabolic panel  Encounter for screening mammogram for malignant neoplasm of breast -     3D Screening Mammogram, Left and Right; Future  Need for immunization against influenza -     Flu Vaccine QUAD 22moIM (Fluarix, Fluzone & Alfiuria Quad PF)    Return in about 1 year (around 08/19/2023).  TCrecencio Mc MD

## 2022-08-18 NOTE — Patient Instructions (Signed)
  It was so nice to see you!   I feel  your grief. U will see your baby again ....  Your annual mammogram has been ordered.  You are encouraged (required) to call to make your appointment at [facility] [phone]

## 2022-08-20 DIAGNOSIS — R7301 Impaired fasting glucose: Secondary | ICD-10-CM | POA: Insufficient documentation

## 2022-08-20 DIAGNOSIS — R5383 Other fatigue: Secondary | ICD-10-CM | POA: Insufficient documentation

## 2022-08-20 DIAGNOSIS — E785 Hyperlipidemia, unspecified: Secondary | ICD-10-CM | POA: Insufficient documentation

## 2022-08-20 NOTE — Assessment & Plan Note (Addendum)
10 yr risk of CAD using the AHA risk calculator is 6%.  Patient has no incidental evidence of atherosclerosis .  No treatment advised at this time    Lab Results  Component Value Date   CHOL 259 (H) 08/18/2022   HDL 80.70 08/18/2022   LDLCALC 143 (H) 08/18/2022   LDLDIRECT 134.0 08/18/2022   TRIG 178.0 (H) 08/18/2022   CHOLHDL 3 08/18/2022

## 2022-08-20 NOTE — Assessment & Plan Note (Signed)

## 2022-08-20 NOTE — Assessment & Plan Note (Signed)
Screening labs normal.  No history of snoring.  Recommended participating in regular exercise program with goal of achieving a minimum of 30 minutes of aerobic activity 5 days per week.   Lab Results  Component Value Date   WBC 5.1 08/18/2022   HGB 14.2 08/18/2022   HCT 42.6 08/18/2022   MCV 90.7 08/18/2022   PLT 221.0 08/18/2022   Lab Results  Component Value Date   TSH 1.19 08/18/2022

## 2022-09-22 ENCOUNTER — Ambulatory Visit
Admission: RE | Admit: 2022-09-22 | Discharge: 2022-09-22 | Disposition: A | Discharge status: Home / Self Care | Payer: BC Managed Care – PPO | Source: Ambulatory Visit | Attending: Internal Medicine | Admitting: Internal Medicine

## 2022-09-22 DIAGNOSIS — Z1231 Encounter for screening mammogram for malignant neoplasm of breast: Secondary | ICD-10-CM | POA: Insufficient documentation

## 2023-01-15 ENCOUNTER — Ambulatory Visit
Admission: EM | Admit: 2023-01-15 | Discharge: 2023-01-15 | Disposition: A | Discharge status: Home / Self Care | Payer: BC Managed Care – PPO | Attending: Emergency Medicine | Admitting: Emergency Medicine

## 2023-01-15 DIAGNOSIS — L259 Unspecified contact dermatitis, unspecified cause: Secondary | ICD-10-CM | POA: Diagnosis not present

## 2023-01-15 DIAGNOSIS — L03113 Cellulitis of right upper limb: Secondary | ICD-10-CM | POA: Diagnosis not present

## 2023-01-15 MED ORDER — PREDNISONE 10 MG (21) PO TBPK: ORAL_TABLET | Freq: Every day | ORAL | 0 refills | Status: DC

## 2023-01-15 MED ORDER — CEPHALEXIN 500 MG PO CAPS: 500.0000 mg | ORAL_CAPSULE | Freq: Four times a day (QID) | ORAL | 0 refills | Status: DC

## 2023-01-15 NOTE — ED Triage Notes (Addendum)
Patient to Urgent Care with complaints of rash present to bilateral arms that is both painful, swollen and feels warm to the touch. Worse on right forearm.   Has been using Cortizone 10, benadryl, and calamine lotion.   Symptoms started on Friday. Denies any fevers. 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

## 2023-01-15 NOTE — ED Provider Notes (Signed)
[facility]    CSN: [id number] Arrival date & time: 01/15/23  1300      History   Chief Complaint Chief Complaint  Patient presents with   Rash    I have a rash on arms that is swollen and feels hot to the touch and hurts. Using Cortizone 10, Benadryl, and Calamine lotion. - Entered by patient    HPI Penny Roberts is a 62 y.o. female.  Patient presents with rash on her forearms x 2 days.  The right forearm has become red and swollen; worse today.  Treatment attempted with Benadryl, calamine lotion, hydrocortisone cream.  No open wounds or drainage.  No fever, chills, or other symptoms.  She works on a farm.  The history is provided by the patient and medical records.    Past Medical History:  Diagnosis Date   Allergy    Cancer (HCC)    skin   Chicken pox     Patient Active Problem List   Diagnosis Date Noted   Other fatigue 08/20/2022   Hyperlipidemia 08/20/2022   Impaired fasting glucose 08/20/2022   Allergic rhinitis due to grass pollen 10/12/2021   Educated about COVID-19 virus infection 06/27/2019   Benign positional vertigo 06/27/2019   Family history of malignant neoplasm of gastrointestinal tract    Benign neoplasm of transverse colon    S/P hysterectomy with oophorectomy 04/19/2016   Atrophic vaginitis 04/19/2016   Encounter for preventive health examination 09/17/2012   Overweight (BMI 25.0-29.9) 05/11/2012   Screening for colon cancer 05/11/2012    Past Surgical History:  Procedure Laterality Date   ABDOMINAL HYSTERECTOMY  2001   due to emdometriosis   COLONOSCOPY WITH PROPOFOL N/A 09/08/2017   Procedure: COLONOSCOPY WITH PROPOFOL;  Surgeon: Midge Minium, MD;  Location: [facility];  Service: Endoscopy;  Laterality: N/A;   POLYPECTOMY  09/08/2017   Procedure: POLYPECTOMY;  Surgeon: Midge Minium, MD;  Location: [facility];  Service: Endoscopy;;   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      Home Medications    Prior to Admission medications   Medication Sig Start Date End Date Taking? Authorizing Provider  cephALEXin (KEFLEX) 500 MG capsule Take 1 capsule (500 mg total) by mouth 4 (four) times daily. 01/15/23  Yes Mickie Bail, NP  predniSONE (STERAPRED UNI-PAK 21 TAB) 10 MG (21) TBPK tablet Take by mouth daily. As directed 01/15/23  Yes Mickie Bail, NP  B Complex Vitamins (B-COMPLEX/B-12) LIQD  06/17/21   [provider]  Calcium Carb-Cholecalciferol (CALCIUM 500 + D PO) Take 1 tablet by mouth daily.    [provider]  Multiple Vitamin (MULTIVITAMIN) capsule Take 1 capsule by mouth daily.    [provider]    Family History Family History  Problem Relation Age of Onset   Heart disease Mother        A-Fib   Breast cancer Mother 19   Cancer Father        colon cancer   Alcohol abuse Father    Hypertension Father    Cancer Maternal Aunt        breast cancer   Breast cancer Maternal Aunt    Breast cancer Maternal Aunt     Social History Social History   Tobacco Use   Smoking status:  Former    Types: Cigarettes    Quit date: 05/09/2004    Years since quitting: 18.6   Smokeless tobacco: Never  Substance Use Topics   Alcohol use: Yes    Alcohol/week: 4.0 standard drinks of alcohol    Types: 4 Glasses of wine per week    Comment: occassional alcohol use   Drug use: No     Allergies   Patient has no known allergies.   Review of Systems Review of Systems  Constitutional:  Negative for chills and fever.  Respiratory:  Negative for cough and shortness of breath.   Cardiovascular:  Negative for chest pain and palpitations.  Skin:  Positive for color change and rash.  All other systems reviewed and are negative.    Physical Exam Triage Vital Signs ED Triage Vitals  Enc Vitals Group     BP 01/15/23 1359 115/70     Pulse Rate  01/15/23 1359 63     Resp 01/15/23 1359 14     Temp 01/15/23 1359 98.6 F (37 C)     Temp src --      SpO2 01/15/23 1359 98 %     Weight --      Height --      Head Circumference --      Peak Flow --      Pain Score 01/15/23 1357 3     Pain Loc --      Pain Edu? --      Excl. in GC? --    No data found.  Updated Vital Signs BP 115/70   Pulse 63   Temp 98.6 F (37 C)   Resp 14   SpO2 98%   Visual Acuity Right Eye Distance:   Left Eye Distance:   Bilateral Distance:    Right Eye Near:   Left Eye Near:    Bilateral Near:     Physical Exam Vitals and nursing note reviewed.  Constitutional:      General: She is not in acute distress.    Appearance: She is well-developed.  HENT:     Mouth/Throat:     Mouth: Mucous membranes are moist.  Cardiovascular:     Rate and Rhythm: Normal rate and regular rhythm.     Heart sounds: Normal heart sounds.  Pulmonary:     Effort: Pulmonary effort is normal. No respiratory distress.     Breath sounds: Normal breath sounds.  Musculoskeletal:     Cervical back: Neck supple.  Skin:    General: Skin is warm and dry.     Findings: Erythema and rash present.     Comments: Vesicular rash on both forearms.  Right forearm erythematous and edematous.  See pictures.  Neurological:     Mental Status: She is alert.  Psychiatric:        Mood and Affect: Mood normal.        Behavior: Behavior normal.         UC Treatments / Results  Labs (all labs ordered are listed, but only abnormal results are displayed) Labs Reviewed - No data to display  EKG   Radiology No results found.  Procedures Procedures (including critical care time)  Medications Ordered in UC Medications - No data to display  Initial Impression / Assessment and Plan / UC Course  I have reviewed the triage vital signs and the nursing notes.  Pertinent labs & imaging results that were available during my care of the patient were reviewed by  me and  considered in my medical decision making (see chart for details).    Cellulitis of right forearm, contact dermatitis on both forearms.  Afebrile and vital signs are stable.  Treating with cephalexin, prednisone, Zyrtec.  Education provided on cellulitis and contact dermatitis.  Instructed patient to follow-up with her PCP tomorrow.  Instructed patient to go to the ED if she sees signs of worsening infection.  She agrees to plan of care.  Final Clinical Impressions(s) / UC Diagnoses   Final diagnoses:  Cellulitis of forearm, right  Contact dermatitis, unspecified contact dermatitis type, unspecified trigger     Discharge Instructions      Take the cephalexin, prednisone, and Zyrtec as directed.  Follow up with your primary care provider in 1-2 days for recheck.  Go to the emergency department if you have worsening symptoms.         ED Prescriptions     Medication Sig Dispense Auth. Provider   cephALEXin (KEFLEX) 500 MG capsule Take 1 capsule (500 mg total) by mouth 4 (four) times daily. 28 capsule Wendee Beavers H, NP   predniSONE (STERAPRED UNI-PAK 21 TAB) 10 MG (21) TBPK tablet Take by mouth daily. As directed 21 tablet Mickie Bail, NP      PDMP not reviewed this encounter.   Mickie Bail, NP 01/15/23 [phone]

## 2023-01-15 NOTE — Discharge Instructions (Addendum)
Take the cephalexin, prednisone, and Zyrtec as directed.  Follow up with your primary care provider in 1-2 days for recheck.  Go to the emergency department if you have worsening symptoms.

## 2023-07-04 ENCOUNTER — Encounter: Payer: Self-pay | Admitting: Family Medicine

## 2023-07-04 ENCOUNTER — Ambulatory Visit: Payer: BC Managed Care – PPO | Admitting: Family Medicine

## 2023-07-04 ENCOUNTER — Telehealth: Payer: Self-pay | Admitting: Internal Medicine

## 2023-07-04 VITALS — BP 114/68 | HR 77 | Temp 97.7°F | Resp 18 | Ht 67.0 in | Wt 183.5 lb

## 2023-07-04 DIAGNOSIS — S29011A Strain of muscle and tendon of front wall of thorax, initial encounter: Secondary | ICD-10-CM

## 2023-07-04 DIAGNOSIS — R059 Cough, unspecified: Secondary | ICD-10-CM | POA: Diagnosis not present

## 2023-07-04 DIAGNOSIS — Z7722 Contact with and (suspected) exposure to environmental tobacco smoke (acute) (chronic): Secondary | ICD-10-CM

## 2023-07-04 LAB — POC COVID19 BINAXNOW: SARS Coronavirus 2 Ag: NEGATIVE

## 2023-07-04 MED ORDER — AIRSUPRA 90-80 MCG/ACT IN AERO: 1.0000 | INHALATION_SPRAY | Freq: Four times a day (QID) | RESPIRATORY_TRACT | 0 refills | Status: DC

## 2023-07-04 NOTE — Telephone Encounter (Signed)
The pharmacy called stating they need a new script sent to Penny Roberts because it was prescribed for 5.9 and it does not come in that. The pharmacy need a script for 10.7

## 2023-07-04 NOTE — Patient Instructions (Addendum)
It was a pleasure meeting you today. Thank you for allowing me to take part in your health care.  Our goals for today as we discussed include:  COVID negative.  Lungs clear and no wheezing.  Will trial Airsupra 1-2 puff as needed for bronchospasm.  Max 12 puffs in 24 hours  You can take Tylenol and/or Ibuprofen as needed for fever reduction and pain relief.   For cough: honey 1/2 to 1 teaspoon (you can dilute the honey in water or another fluid).  You can also use guaifenesin and dextromethorphan for cough. You can use a humidifier for chest congestion and cough.  If you don't have a humidifier, you can sit in the bathroom with the hot shower running.      For sore throat: try warm salt water gargles, cepacol lozenges, throat spray, warm tea or water with lemon/honey, popsicles or ice, or OTC cold relief medicine for throat discomfort.   For congestion: take a daily anti-histamine like Zyrtec, Claritin, and a oral decongestant, such as pseudoephedrine.  You can also use Flonase 1-2 sprays in each nostril daily.   It is important to stay hydrated: drink plenty of fluids (water, gatorade/powerade/pedialyte, juices, or teas) to keep your throat moisturized and help further relieve irritation/discomfort.    If you have any questions or concerns, please do not hesitate to call the office at [phone].  I look forward to our next visit and until then take care and stay safe.  Regards,   Dana Allan, MD   [facility]

## 2023-07-04 NOTE — Telephone Encounter (Signed)
 Rx has been corrected and resent

## 2023-07-04 NOTE — Progress Notes (Signed)
SUBJECTIVE:   Chief Complaint  Patient presents with   Cough    X 1 week hurts under breast when cough   HPI Presents to clinic for acute visit  Discussed the use of AI scribe software for clinical note transcription with the patient, who gave verbal consent to proceed.  History of Present Illness  The patient, with a history of allergies and sensitivity to smoke and strong smells, presents with a week-long history of a persistent cough and chest congestion. They describe the cough as severe enough to cause muscular pain in the chest, which is alleviated by engaging their core during coughing. The patient denies any fever or difficulty breathing. They report significant chest congestion, with dark green sputum production, particularly in the morning. The patient denies any nasal congestion, despite a nasal voice quality.  The patient recently traveled to [facility], where they were exposed to significant amounts of secondhand smoke and strong colognes, both of which they are sensitive to. They have a remote history of smoking but quit years ago. They have been managing their symptoms with over-the-counter cold medications and Robitussin DM, which they report has been effective in quieting their cough, particularly at night.  The patient also reports a history of using an inhaler in the past, but cannot recall the specific circumstances. They have been using a saline nasal spray to manage their symptoms. They deny any headaches, ear pain, or difficulty swallowing, and report that they have been able to eat and drink normally.    PERTINENT PMH / PSH: As above  OBJECTIVE:  BP 114/68   Pulse 77   Temp 97.7 F (36.5 C)   Resp 18   Ht 5\' 7"  (1.702 m)   Wt 183 lb 8 oz (83.2 kg)   SpO2 98%   BMI 28.74 kg/m    Physical Exam Vitals reviewed.  Constitutional:      General: She is not in acute distress.    Appearance: She is not ill-appearing.  HENT:     Head: Normocephalic.      Right Ear: Tympanic membrane, ear canal and external ear normal.     Left Ear: Tympanic membrane, ear canal and external ear normal.     Nose: Nose normal.     Mouth/Throat:     Mouth: Mucous membranes are moist.  Eyes:     Extraocular Movements: Extraocular movements intact.     Conjunctiva/sclera: Conjunctivae normal.     Pupils: Pupils are equal, round, and reactive to light.  Neck:     Vascular: No carotid bruit.  Cardiovascular:     Rate and Rhythm: Normal rate and regular rhythm.     Pulses: Normal pulses.     Heart sounds: Normal heart sounds.  Pulmonary:     Effort: Pulmonary effort is normal.     Breath sounds: Normal breath sounds. No wheezing or rhonchi.  Abdominal:     General: Bowel sounds are normal. There is no distension.     Palpations: Abdomen is soft.     Tenderness: There is no abdominal tenderness. There is no right CVA tenderness, left CVA tenderness, guarding or rebound.  Musculoskeletal:        General: Normal range of motion.     Cervical back: Normal range of motion.     Right lower leg: No edema.     Left lower leg: No edema.  Lymphadenopathy:     Cervical: No cervical adenopathy.  Skin:    Capillary Refill:  Capillary refill takes less than 2 seconds.  Neurological:     General: No focal deficit present.     Mental Status: She is alert and oriented to person, place, and time. Mental status is at baseline.     Motor: No weakness.  Psychiatric:        Mood and Affect: Mood normal.        Behavior: Behavior normal.        Thought Content: Thought content normal.        Judgment: Judgment normal.        07/04/2023    1:32 PM 08/18/2022    8:46 AM 10/12/2021    8:35 AM 08/17/2021    3:37 PM 06/29/2020    2:31 PM  Depression screen PHQ 2/9  Decreased Interest 0 0 0 0 0  Down, Depressed, Hopeless 0 0 0 0 0  PHQ - 2 Score 0 0 0 0 0  Altered sleeping 0 0     Tired, decreased energy 0 0     Change in appetite 0 0     Feeling bad or failure about  yourself  0 0     Trouble concentrating 0 0     Moving slowly or fidgety/restless 0 0     Suicidal thoughts 0 0     PHQ-9 Score 0 0     Difficult doing work/chores Not difficult at all Not difficult at all         07/04/2023    1:32 PM 08/18/2022    8:46 AM  GAD 7 : Generalized Anxiety Score  Nervous, Anxious, on Edge 0 1  Control/stop worrying 0 1  Worry too much - different things 0 0  Trouble relaxing 0 1  Restless 0 0  Easily annoyed or irritable 0 0  Afraid - awful might happen 0 0  Total GAD 7 Score 0 3  Anxiety Difficulty Not difficult at all Not difficult at all    ASSESSMENT/PLAN:  Cough, unspecified type Assessment & Plan: Persistent cough for one week with chest congestion and dark green sputum. No fever, dyspnea, or wheezing. Improvement noted with over-the-counter cough suppressants. No signs of pneumonia on physical examination. Likely viral etiology. -Provide Airsupra inhaler for symptomatic relief of cough as needed. -Continue over-the-counter cough suppressants. -Consider chest x-ray if symptoms worsen or fever develops.  Orders: -     POC COVID-19 BinaxNow  Muscle strain of chest wall, initial encounter Assessment & Plan: Pain in the chest likely secondary to persistent coughing. No signs of pleurisy on examination. -Recommend heat packs, over-the-counter analgesics such as ibuprofen, and topical treatments like diclofenac gel or Biofreeze for pain relief.   Second hand smoke exposure Assessment & Plan: Recent exposure to secondhand smoke in [place] casino setting, which may have exacerbated respiratory symptoms. -Advise to avoid exposure to secondhand smoke.    General Health Maintenance / Followup Plans -Advise to wear a mask and practice good hand hygiene when visiting newborn grandchild. -Follow up if symptoms do not improve or worsen in the next few days.    PDMP reviewed  Return if symptoms worsen or fail to improve, for PCP.  Dana Allan, MD

## 2023-07-11 ENCOUNTER — Encounter: Payer: Self-pay | Admitting: Family Medicine

## 2023-07-11 DIAGNOSIS — R059 Cough, unspecified: Secondary | ICD-10-CM | POA: Insufficient documentation

## 2023-07-11 DIAGNOSIS — Z7722 Contact with and (suspected) exposure to environmental tobacco smoke (acute) (chronic): Secondary | ICD-10-CM | POA: Insufficient documentation

## 2023-07-11 DIAGNOSIS — S29011A Strain of muscle and tendon of front wall of thorax, initial encounter: Secondary | ICD-10-CM | POA: Insufficient documentation

## 2023-07-11 NOTE — Assessment & Plan Note (Addendum)
Persistent cough for one week with chest congestion and dark green sputum. No fever, dyspnea, or wheezing. Improvement noted with over-the-counter cough suppressants. No signs of pneumonia on physical examination. Likely viral etiology. -Provide Airsupra inhaler for symptomatic relief of cough as needed. -Continue over-the-counter cough suppressants. -Consider chest x-ray if symptoms worsen or fever develops.

## 2023-07-11 NOTE — Assessment & Plan Note (Signed)
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 -Advise to avoid exposure to secondhand smoke.

## 2023-07-11 NOTE — Assessment & Plan Note (Signed)
Pain in the chest likely secondary to persistent coughing. No signs of pleurisy on examination. -Recommend heat packs, over-the-counter analgesics such as ibuprofen, and topical treatments like diclofenac gel or Biofreeze for pain relief.

## 2023-07-17 ENCOUNTER — Encounter: Payer: Self-pay | Admitting: Family Medicine

## 2023-07-18 ENCOUNTER — Other Ambulatory Visit: Payer: Self-pay | Admitting: Family Medicine

## 2023-07-18 DIAGNOSIS — J40 Bronchitis, not specified as acute or chronic: Secondary | ICD-10-CM

## 2023-07-18 MED ORDER — PREDNISONE 20 MG PO TABS: 20.0000 mg | ORAL_TABLET | Freq: Every day | ORAL | 0 refills | Status: AC

## 2023-07-18 MED ORDER — AZITHROMYCIN 250 MG PO TABS: ORAL_TABLET | ORAL | 0 refills | Status: AC

## 2023-08-21 ENCOUNTER — Encounter: Payer: Self-pay | Admitting: Internal Medicine

## 2023-08-21 ENCOUNTER — Ambulatory Visit (INDEPENDENT_AMBULATORY_CARE_PROVIDER_SITE_OTHER): Payer: 59 | Admitting: Internal Medicine

## 2023-08-21 VITALS — BP 94/68 | HR 63 | Ht 67.0 in | Wt 183.8 lb

## 2023-08-21 DIAGNOSIS — Z23 Encounter for immunization: Secondary | ICD-10-CM | POA: Diagnosis not present

## 2023-08-21 DIAGNOSIS — Z90721 Acquired absence of ovaries, unilateral: Secondary | ICD-10-CM

## 2023-08-21 DIAGNOSIS — Z90710 Acquired absence of both cervix and uterus: Secondary | ICD-10-CM

## 2023-08-21 DIAGNOSIS — R7301 Impaired fasting glucose: Secondary | ICD-10-CM

## 2023-08-21 DIAGNOSIS — E785 Hyperlipidemia, unspecified: Secondary | ICD-10-CM | POA: Diagnosis not present

## 2023-08-21 DIAGNOSIS — Z1211 Encounter for screening for malignant neoplasm of colon: Secondary | ICD-10-CM

## 2023-08-21 DIAGNOSIS — Z0000 Encounter for general adult medical examination without abnormal findings: Secondary | ICD-10-CM

## 2023-08-21 DIAGNOSIS — E663 Overweight: Secondary | ICD-10-CM

## 2023-08-21 DIAGNOSIS — Z1231 Encounter for screening mammogram for malignant neoplasm of breast: Secondary | ICD-10-CM

## 2023-08-21 LAB — COMPREHENSIVE METABOLIC PANEL
ALT: 30 U/L (ref 0–35)
AST: 29 U/L (ref 0–37)
Albumin: 4.1 g/dL (ref 3.5–5.2)
Alkaline Phosphatase: 60 U/L (ref 39–117)
BUN: 13 mg/dL (ref 6–23)
CO2: 30 meq/L (ref 19–32)
Calcium: 9.1 mg/dL (ref 8.4–10.5)
Chloride: 99 meq/L (ref 96–112)
Creatinine, Ser: 0.76 mg/dL (ref 0.40–1.20)
GFR: 84.37 mL/min (ref >60.00)
Glucose, Bld: 80 mg/dL (ref 70–99)
Potassium: 3.6 meq/L (ref 3.5–5.1)
Sodium: 139 meq/L (ref 135–145)
Total Bilirubin: 0.9 mg/dL (ref 0.2–1.2)
Total Protein: 7.1 g/dL (ref 6.0–8.3)

## 2023-08-21 LAB — CBC WITH DIFFERENTIAL/PLATELET
Basophils Absolute: 0 10*3/uL (ref 0.0–0.1)
Basophils Relative: 1.1 % (ref 0.0–3.0)
Eosinophils Absolute: 0.2 10*3/uL (ref 0.0–0.7)
Eosinophils Relative: 4.5 % (ref 0.0–5.0)
HCT: 42.9 % (ref 36.0–46.0)
Hemoglobin: 14.4 g/dL (ref 12.0–15.0)
Lymphocytes Relative: 32.5 % (ref 12.0–46.0)
Lymphs Abs: 1.5 10*3/uL (ref 0.7–4.0)
MCHC: 33.6 g/dL (ref 30.0–36.0)
MCV: 90.8 fL (ref 78.0–100.0)
Monocytes Absolute: 0.5 10*3/uL (ref 0.1–1.0)
Monocytes Relative: 9.9 % (ref 3.0–12.0)
Neutro Abs: 2.4 10*3/uL (ref 1.4–7.7)
Neutrophils Relative %: 52 % (ref 43.0–77.0)
Platelets: 272 10*3/uL (ref 150.0–400.0)
RBC: 4.73 Mil/uL (ref 3.87–5.11)
RDW: 14.1 % (ref 11.5–15.5)
WBC: 4.6 10*3/uL (ref 4.0–10.5)

## 2023-08-21 LAB — HEMOGLOBIN A1C: Hgb A1c MFr Bld: 5.5 % (ref 4.6–6.5)

## 2023-08-21 LAB — LIPID PANEL
Cholesterol: 268 mg/dL — ABNORMAL HIGH (ref 0–200)
HDL: 93 mg/dL (ref >39.00)
LDL Cholesterol: 153 mg/dL — ABNORMAL HIGH (ref 0–99)
NonHDL: 174.68
Total CHOL/HDL Ratio: 3
Triglycerides: 108 mg/dL (ref 0.0–149.0)
VLDL: 21.6 mg/dL (ref 0.0–40.0)

## 2023-08-21 LAB — TSH: TSH: 1.2 u[IU]/mL (ref 0.35–5.50)

## 2023-08-21 LAB — LDL CHOLESTEROL, DIRECT: Direct LDL: 155 mg/dL

## 2023-08-21 MED ORDER — POTASSIUM CHLORIDE CRYS ER 20 MEQ PO TBCR: 20.0000 meq | EXTENDED_RELEASE_TABLET | ORAL | 0 refills | Status: AC | PRN

## 2023-08-21 MED ORDER — FUROSEMIDE 20 MG PO TABS: 20.0000 mg | ORAL_TABLET | ORAL | 0 refills | Status: AC | PRN

## 2023-08-21 NOTE — Assessment & Plan Note (Signed)
A1c has been checked and normal/   Lab Results  Component Value Date   HGBA1C 5.5 08/18/2022

## 2023-08-21 NOTE — Assessment & Plan Note (Signed)
I have addressed  BMI and recommended a low glycemic index diet utilizing smaller more frequent meals to increase metabolism.  I have also recommended that patient start exercising with a goal of 30 minutes of aerobic exercise a minimum of 5 days per week. Screening for lipid disorders, thyroid and diabetes will be repeated  today.  Lab Results  Component Value Date   TSH 1.19 08/18/2022   Lab Results  Component Value Date   CHOL 259 (H) 08/18/2022   HDL 80.70 08/18/2022   LDLCALC 143 (H) 08/18/2022   LDLDIRECT 134.0 08/18/2022   TRIG 178.0 (H) 08/18/2022   CHOLHDL 3 08/18/2022   Lab Results  Component Value Date   HGBA1C 5.5 08/18/2022

## 2023-08-21 NOTE — Assessment & Plan Note (Signed)

## 2023-08-21 NOTE — Progress Notes (Signed)
Patient ID: Penny Roberts, female    DOB: 13-Feb-1961  Age: 63 y.o. MRN: [id number]  The patient is here for annual preventive examination and management of other chronic and acute problems.   The risk factors are reflected in the social history.   The roster of all physicians providing medical care to patient - is listed in the Snapshot section of the chart.   Activities of daily living:  The patient is 100% independent in all ADLs: dressing, toileting, feeding as well as independent mobility   Home safety : The patient has smoke detectors in the home. They wear seatbelts.  There are no unsecured firearms at home. There is no violence in the home.    There is no risks for hepatitis, STDs or HIV. There is no   history of blood transfusion. They have no travel history to infectious disease endemic areas of the world.   The patient has seen their dentist in the last six month. They have seen their eye doctor in the last year. The patinet  denies slight hearing difficulty with regard to whispered voices and some television programs.  They have deferred audiologic testing in the last year.  They do not  have excessive sun exposure. Discussed the need for sun protection: hats, long sleeves and use of sunscreen if there is significant sun exposure.    Diet: the importance of a healthy diet is discussed. They do have a healthy diet.   The benefits of regular aerobic exercise were discussed. The patient  exercises  3 to 5 days per week  for  60 minutes.    Depression screen: there are no signs or vegative symptoms of depression- irritability, change in appetite, anhedonia, sadness/tearfullness.   The following portions of the patient's history were reviewed and updated as appropriate: allergies, current medications, past family history, past medical history,  past surgical history, past social history  and problem list.   Visual acuity was not assessed per patient preference since the patient has  regular follow up with an  ophthalmologist. Hearing and body mass index were assessed and reviewed.    During the course of the visit the patient was educated and counseled about appropriate screening and preventive services including : fall prevention , diabetes screening, nutrition counseling, colorectal cancer screening, and recommended immunizations.    Chief Complaint:   1) Recovered from bronchitis:  treated with prednisone and azithromycin on Dec 31 by TW  after symptoms failed to resolve with just AirSupra x 2 weeks.  Symptoms started after a trip to [facility] and 2nd hand exposure to smoke and colognes.    2) retired in September :  loving life.  getting back into a workout routine and [employer].  Has lost 13 lbs over several month weighing in   , taking exercise classes.     Review of Symptoms  Patient denies headache, fevers, malaise, unintentional weight loss, skin rash, eye pain, sinus congestion and sinus pain, sore throat, dysphagia,  hemoptysis , cough, dyspnea, wheezing, chest pain, palpitations, orthopnea, edema, abdominal pain, nausea, melena, diarrhea, constipation, flank pain, dysuria, hematuria, urinary  Frequency, nocturia, numbness, tingling, seizures,  Focal weakness, Loss of consciousness,  Tremor, insomnia, depression, anxiety, and suicidal ideation.    Physical Exam:  BP 94/68   Pulse 63   Ht 5\' 7"  (1.702 m)   Wt 183 lb 12.8 oz (83.4 kg)   SpO2 97%   BMI 28.79 kg/m    Physical Exam Vitals reviewed.  Constitutional:      General: She is not in acute distress.    Appearance: Normal appearance. She is well-developed and normal weight. She is not ill-appearing, toxic-appearing or diaphoretic.  HENT:     Head: Normocephalic.     Right Ear: Tympanic membrane, ear canal and external ear normal. There is no impacted cerumen.     Left Ear: Tympanic membrane, ear canal and external ear normal. There is no impacted cerumen.     Nose: Nose normal.      Mouth/Throat:     Mouth: Mucous membranes are moist.     Pharynx: Oropharynx is clear.  Eyes:     General: No scleral icterus.       Right eye: No discharge.        Left eye: No discharge.     Conjunctiva/sclera: Conjunctivae normal.     Pupils: Pupils are equal, round, and reactive to light.  Neck:     Thyroid: No thyromegaly.     Vascular: No carotid bruit or JVD.  Cardiovascular:     Rate and Rhythm: Normal rate and regular rhythm.     Heart sounds: Normal heart sounds.  Pulmonary:     Effort: Pulmonary effort is normal. No respiratory distress.     Breath sounds: Normal breath sounds.  Chest:  Breasts:    Breasts are symmetrical.     Right: Normal. No swelling, inverted nipple, mass, nipple discharge, skin change or tenderness.     Left: Normal. No swelling, inverted nipple, mass, nipple discharge, skin change or tenderness.  Abdominal:     General: Bowel sounds are normal.     Palpations: Abdomen is soft. There is no mass.     Tenderness: There is no abdominal tenderness. There is no guarding or rebound.  Musculoskeletal:        General: Normal range of motion.     Cervical back: Normal range of motion and neck supple.  Lymphadenopathy:     Cervical: No cervical adenopathy.     Upper Body:     Right upper body: No supraclavicular, axillary or pectoral adenopathy.     Left upper body: No supraclavicular, axillary or pectoral adenopathy.  Skin:    General: Skin is warm and dry.  Neurological:     General: No focal deficit present.     Mental Status: She is alert and oriented to person, place, and time. Mental status is at baseline.  Psychiatric:        Mood and Affect: Mood normal.        Behavior: Behavior normal.        Thought Content: Thought content normal.        Judgment: Judgment normal.    Assessment and Plan: Colon cancer screening -     Ambulatory referral to Gastroenterology  Encounter for screening mammogram for malignant neoplasm of breast -     3D  Screening Mammogram, Left and Right; Future  Encounter for preventive health examination Assessment & Plan: age appropriate education and counseling updated, referrals for preventative services and immunizations addressed, dietary and smoking counseling addressed, most recent labs reviewed.  I have personally reviewed and have noted:   1) the patient's medical and social history 2) The pt's use of alcohol, tobacco, and illicit drugs 3) The patient's current medications and supplements 4) Functional ability including ADL's, fall risk, home safety risk, hearing and visual impairment 5) Diet and physical activities 6) Evidence for depression or mood disorder 7) The patient's  height, weight, and BMI have been recorded in the chart  I have made referrals, and provided counseling and education based on review of the above    Overweight (BMI 25.0-29.9) Assessment & Plan: I have addressed  BMI and recommended a low glycemic index diet utilizing smaller more frequent meals to increase metabolism.  I have also recommended that patient start exercising with a goal of 30 minutes of aerobic exercise a minimum of 5 days per week. Screening for lipid disorders, thyroid and diabetes will be repeated  today.  Lab Results  Component Value Date   TSH 1.19 08/18/2022   Lab Results  Component Value Date   CHOL 259 (H) 08/18/2022   HDL 80.70 08/18/2022   LDLCALC 143 (H) 08/18/2022   LDLDIRECT 134.0 08/18/2022   TRIG 178.0 (H) 08/18/2022   CHOLHDL 3 08/18/2022   Lab Results  Component Value Date   HGBA1C 5.5 08/18/2022       Orders: -     Comprehensive metabolic panel -     Hemoglobin A1c -     CBC with Differential/Platelet -     TSH  Hyperlipidemia, unspecified hyperlipidemia type -     Lipid panel -     LDL cholesterol, direct  S/P hysterectomy with oophorectomy  Need for Tdap vaccination -     Tdap vaccine greater than or equal to 7yo IM  Impaired fasting glucose Assessment &  Plan: A1c has been checked and normal/   Lab Results  Component Value Date   HGBA1C 5.5 08/18/2022      Other orders -     Furosemide; Take 1 tablet (20 mg total) by mouth as needed.  Dispense: 20 tablet; Refill: 0 -     Potassium Chloride C[facility] ER; Take 1 tablet (20 mEq total) by mouth as needed.  Dispense: 20 tablet; Refill: 0    No follow-ups on file.  Sherlene Shams, MD

## 2023-08-22 ENCOUNTER — Encounter: Payer: Self-pay | Admitting: Internal Medicine

## 2023-12-22 ENCOUNTER — Ambulatory Visit
Admission: RE | Admit: 2023-12-22 | Discharge: 2023-12-22 | Disposition: A | Discharge status: Home / Self Care | Source: Ambulatory Visit | Attending: Internal Medicine | Admitting: Internal Medicine

## 2023-12-22 DIAGNOSIS — Z1231 Encounter for screening mammogram for malignant neoplasm of breast: Secondary | ICD-10-CM | POA: Insufficient documentation

## 2023-12-27 ENCOUNTER — Ambulatory Visit: Payer: Self-pay

## 2023-12-27 DIAGNOSIS — K6289 Other specified diseases of anus and rectum: Secondary | ICD-10-CM | POA: Diagnosis not present

## 2023-12-27 DIAGNOSIS — Z1211 Encounter for screening for malignant neoplasm of colon: Secondary | ICD-10-CM | POA: Diagnosis present

## 2023-12-27 DIAGNOSIS — K635 Polyp of colon: Secondary | ICD-10-CM | POA: Diagnosis not present

## 2023-12-27 DIAGNOSIS — Z800 Family history of malignant neoplasm of digestive organs: Secondary | ICD-10-CM | POA: Diagnosis not present

## 2024-08-23 ENCOUNTER — Encounter: Payer: Self-pay | Admitting: Internal Medicine

## 2024-08-23 ENCOUNTER — Ambulatory Visit: Payer: 59 | Admitting: Internal Medicine

## 2024-08-23 VITALS — BP 110/68 | HR 56 | Temp 98.0°F | Ht 67.0 in | Wt 186.0 lb

## 2024-08-23 DIAGNOSIS — R012 Other cardiac sounds: Secondary | ICD-10-CM | POA: Insufficient documentation

## 2024-08-23 DIAGNOSIS — E785 Hyperlipidemia, unspecified: Secondary | ICD-10-CM

## 2024-08-23 DIAGNOSIS — Z9089 Acquired absence of other organs: Secondary | ICD-10-CM | POA: Insufficient documentation

## 2024-08-23 DIAGNOSIS — E663 Overweight: Secondary | ICD-10-CM

## 2024-08-23 LAB — LIPID PANEL
Cholesterol: 248 mg/dL — ABNORMAL HIGH (ref 28–200)
HDL: 80.7 mg/dL
LDL Cholesterol: 141 mg/dL — ABNORMAL HIGH (ref 10–99)
NonHDL: 167.34
Total CHOL/HDL Ratio: 3
Triglycerides: 130 mg/dL (ref 10.0–149.0)
VLDL: 26 mg/dL (ref 0.0–40.0)

## 2024-08-23 LAB — LDL CHOLESTEROL, DIRECT: Direct LDL: 135 mg/dL

## 2024-08-23 LAB — CBC WITH DIFFERENTIAL/PLATELET
Basophils Absolute: 0 10*3/uL (ref 0.0–0.1)
Basophils Relative: 0.8 % (ref 0.0–3.0)
Eosinophils Absolute: 0.1 10*3/uL (ref 0.0–0.7)
Eosinophils Relative: 3.1 % (ref 0.0–5.0)
HCT: 41.6 % (ref 36.0–46.0)
Hemoglobin: 13.7 g/dL (ref 12.0–15.0)
Lymphocytes Relative: 29.9 % (ref 12.0–46.0)
Lymphs Abs: 1.4 10*3/uL (ref 0.7–4.0)
MCHC: 32.9 g/dL (ref 30.0–36.0)
MCV: 89.9 fl (ref 78.0–100.0)
Monocytes Absolute: 0.4 10*3/uL (ref 0.1–1.0)
Monocytes Relative: 9.2 % (ref 3.0–12.0)
Neutro Abs: 2.6 10*3/uL (ref 1.4–7.7)
Neutrophils Relative %: 57 % (ref 43.0–77.0)
Platelets: 208 10*3/uL (ref 150.0–400.0)
RBC: 4.63 Mil/uL (ref 3.87–5.11)
RDW: 14.1 % (ref 11.5–15.5)
WBC: 4.6 10*3/uL (ref 4.0–10.5)

## 2024-08-23 LAB — COMPREHENSIVE METABOLIC PANEL WITH GFR
ALT: 18 U/L (ref 3–35)
AST: 21 U/L (ref 5–37)
Albumin: 4.1 g/dL (ref 3.5–5.2)
Alkaline Phosphatase: 64 U/L (ref 39–117)
BUN: 17 mg/dL (ref 6–23)
CO2: 32 meq/L (ref 19–32)
Calcium: 9.1 mg/dL (ref 8.4–10.5)
Chloride: 103 meq/L (ref 96–112)
Creatinine, Ser: 0.67 mg/dL (ref 0.40–1.20)
GFR: 93.45 mL/min
Glucose, Bld: 82 mg/dL (ref 70–99)
Potassium: 4.3 meq/L (ref 3.5–5.1)
Sodium: 141 meq/L (ref 135–145)
Total Bilirubin: 0.6 mg/dL (ref 0.2–1.2)
Total Protein: 6.4 g/dL (ref 6.0–8.3)

## 2024-08-23 LAB — HEMOGLOBIN A1C: Hgb A1c MFr Bld: 5.7 % (ref 4.6–6.5)

## 2024-08-23 LAB — TSH: TSH: 0.67 u[IU]/mL (ref 0.35–5.50)

## 2024-08-23 MED ORDER — OMEPRAZOLE 40 MG PO CPDR: 40.0000 mg | DELAYED_RELEASE_CAPSULE | Freq: Every day | ORAL | 0 refills | Status: AC

## 2024-08-23 NOTE — Patient Instructions (Addendum)
 For the swelling in your wrist:   I recommend that you  take the meloxicam daily for 3-4 weeks  (along with using ice) . You can increase the dose to 15 mg daily  but not above that ,  and DO NOT COMBINE WITH ALEVE OR ADVIL   I'll send omeprazole  to pharmacy to protect stomach from gastritis during this time.  Take once daily on an empty stomach  You can ADD 3000 mg of acetominophen (tylenol) every day safely  In divided doses (750 mg  every 6 hours  Or 1000 mg every 8 hours.)   Your heart sounds have a persistent abnormality called a split s1.  The cause is unclear,  so I'm sending you to [facility] for further evaluation, which will likely include an ECHO.  The EKG did not show any abnormalitis    YOUR MAMMOGRAM is due in June.  And has been ordered , PLEASE CALL AND GET THIS SCHEDULED    [facility] - call [phone]  Veronda does  the scheduling for [facility] as well

## 2024-08-23 NOTE — Assessment & Plan Note (Signed)
 Ddx includes RBBB vs septal defect.  EKG/cardiology referral ordered

## 2024-08-23 NOTE — Progress Notes (Unsigned)
 Patient ID: Penny Roberts, female    DOB: January 14, 1961  Age: 64 y.o. MRN: [id number]  The patient is here for annual preventive examination and management of other chronic and acute problems.   The risk factors are reflected in the social history.   The roster of all physicians providing medical care to patient - is listed in the Snapshot section of the chart.   Activities of daily living:  The patient is 100% independent in all ADLs: dressing, toileting, feeding as well as independent mobility   Home safety : The patient has smoke detectors in the home. They wear seatbelts.  There are no unsecured firearms at home. There is no violence in the home.    There is no risks for hepatitis, STDs or HIV. There is no   history of blood transfusion. They have no travel history to infectious disease endemic areas of the world.   The patient has seen their dentist in the last six month. They have seen their eye doctor in the last year. The patinet  denies slight hearing difficulty with regard to whispered voices and some television programs.  They have deferred audiologic testing in the last year.  They do not  have excessive sun exposure. Discussed the need for sun protection: hats, long sleeves and use of sunscreen if there is significant sun exposure.    Diet: the importance of a healthy diet is discussed. They do have a healthy diet.   The benefits of regular aerobic exercise were discussed. The patient  exercises  3 to 5 days per week  for  60 minutes.    Depression screen: there are no signs or vegative symptoms of depression- irritability, change in appetite, anhedonia, sadness/tearfullness.   The following portions of the patient's history were reviewed and updated as appropriate: allergies, current medications, past family history, past medical history,  past surgical history, past social history  and problem list.   Visual acuity was not assessed per patient preference since the patient has  regular follow up with an  ophthalmologist. Hearing and body mass index were assessed and reviewed.    During the course of the visit the patient was educated and counseled about appropriate screening and preventive services including : fall prevention , diabetes screening, nutrition counseling, colorectal cancer screening, and recommended immunizations.    Chief Complaint:   None.  Has retired  from [employer]. Since last visit.    Mother has now moved in with her.       Review of Symptoms  Patient denies headache, fevers, malaise, unintentional weight loss, skin rash, eye pain, sinus congestion and sinus pain, sore throat, dysphagia,  hemoptysis , cough, dyspnea, wheezing, chest pain, palpitations, orthopnea, edema, abdominal pain, nausea, melena, diarrhea, constipation, flank pain, dysuria, hematuria, urinary  Frequency, nocturia, numbness, tingling, seizures,  Focal weakness, Loss of consciousness,  Tremor, insomnia, depression, anxiety, and suicidal ideation.    Physical Exam:  BP 110/68 (BP Location: Left Arm)   Pulse (!) 56   Temp 98 F (36.7 C)   Ht 5' 7 (1.702 m)   Wt 186 lb (84.4 kg)   SpO2 98%   BMI 29.13 kg/m    Physical Exam Vitals reviewed.  Constitutional:      General: She is not in acute distress.    Appearance: Normal appearance. She is well-developed and normal weight. She is not ill-appearing, toxic-appearing or diaphoretic.  HENT:     Head: Normocephalic.     Right Ear: Tympanic membrane,  ear canal and external ear normal. There is no impacted cerumen.     Left Ear: Tympanic membrane, ear canal and external ear normal. There is no impacted cerumen.     Nose: Nose normal.     Mouth/Throat:     Mouth: Mucous membranes are moist.     Pharynx: Oropharynx is clear.  Eyes:     General: No scleral icterus.       Right eye: No discharge.        Left eye: No discharge.     Conjunctiva/sclera: Conjunctivae normal.     Pupils: Pupils are equal, round, and reactive to  light.  Neck:     Thyroid : No thyromegaly.     Vascular: No carotid bruit or JVD.  Cardiovascular:     Rate and Rhythm: Normal rate and regular rhythm.     Heart sounds: S2 normal. Murmur heard.  Pulmonary:     Effort: Pulmonary effort is normal. No respiratory distress.     Breath sounds: Normal breath sounds.  Chest:  Breasts:    Breasts are symmetrical.     Right: Normal. No swelling, inverted nipple, mass, nipple discharge, skin change or tenderness.     Left: Normal. No swelling, inverted nipple, mass, nipple discharge, skin change or tenderness.  Abdominal:     General: Bowel sounds are normal.     Palpations: Abdomen is soft. There is no mass.     Tenderness: There is no abdominal tenderness. There is no guarding or rebound.  Musculoskeletal:        General: Normal range of motion.     Cervical back: Normal range of motion and neck supple.     Right lower leg: No edema.     Left lower leg: No edema.  Lymphadenopathy:     Cervical: No cervical adenopathy.     Upper Body:     Right upper body: No supraclavicular, axillary or pectoral adenopathy.     Left upper body: No supraclavicular, axillary or pectoral adenopathy.  Skin:    General: Skin is warm and dry.  Neurological:     General: No focal deficit present.     Mental Status: She is alert and oriented to person, place, and time. Mental status is at baseline.  Psychiatric:        Mood and Affect: Mood normal.        Behavior: Behavior normal.        Thought Content: Thought content normal.        Judgment: Judgment normal.     Assessment and Plan: Split S1 (first heart sound) Assessment & Plan: Ddx includes RBBB vs septal defect.  EKG/cardiology referral ordered   Orders: -     EKG 12-Lead -     Ambulatory referral to Cardiology  Overweight (BMI 25.0-29.9) -     TSH -     CBC with Differential/Platelet -     Hemoglobin A1c -     Comprehensive metabolic panel with GFR  Hyperlipidemia, unspecified  hyperlipidemia type -     Lipid panel -     LDL cholesterol, direct  History of thymectomy  Other orders -     Omeprazole ; Take 1 capsule (40 mg total) by mouth daily.  Dispense: 30 capsule; Refill: 0    No follow-ups on file.  Verneita LITTIE Kettering, MD

## 2024-08-26 ENCOUNTER — Ambulatory Visit: Admitting: Cardiovascular Disease

## 2025-08-27 ENCOUNTER — Encounter: Admitting: Internal Medicine
